# Patient Record
Sex: Female | Born: 1980
Health system: Southern US, Community
[De-identification: ages and names within clinical notes are randomized; demographics above are authoritative.]

## PROBLEM LIST (undated history)

## (undated) DIAGNOSIS — D649 Anemia, unspecified: Secondary | ICD-10-CM

## (undated) DIAGNOSIS — O039 Complete or unspecified spontaneous abortion without complication: Secondary | ICD-10-CM

## (undated) DIAGNOSIS — B009 Herpesviral infection, unspecified: Secondary | ICD-10-CM

## (undated) HISTORY — PX: WISDOM TOOTH EXTRACTION: SHX21

## (undated) HISTORY — PX: ABDOMINAL HYSTERECTOMY: SHX81

## (undated) HISTORY — DX: Anemia, unspecified: D64.9

---

## 2008-01-19 DIAGNOSIS — O039 Complete or unspecified spontaneous abortion without complication: Secondary | ICD-10-CM

## 2008-01-19 HISTORY — DX: Complete or unspecified spontaneous abortion without complication: O03.9

## 2012-02-19 LAB — HM PAP SMEAR: HM Pap smear: NORMAL

## 2012-06-08 ENCOUNTER — Ambulatory Visit: Payer: Self-pay | Admitting: Family Medicine

## 2012-06-15 ENCOUNTER — Encounter: Payer: Self-pay | Admitting: Family Medicine

## 2012-06-15 ENCOUNTER — Telehealth: Payer: Self-pay

## 2012-06-15 ENCOUNTER — Ambulatory Visit (INDEPENDENT_AMBULATORY_CARE_PROVIDER_SITE_OTHER): Payer: PRIVATE HEALTH INSURANCE | Admitting: Family Medicine

## 2012-06-15 VITALS — BP 120/74 | Temp 98.2°F | Ht 68.0 in | Wt 167.0 lb

## 2012-06-15 DIAGNOSIS — Z309 Encounter for contraceptive management, unspecified: Secondary | ICD-10-CM

## 2012-06-15 DIAGNOSIS — IMO0001 Reserved for inherently not codable concepts without codable children: Secondary | ICD-10-CM

## 2012-06-15 DIAGNOSIS — Z7189 Other specified counseling: Secondary | ICD-10-CM

## 2012-06-15 DIAGNOSIS — Z7689 Persons encountering health services in other specified circumstances: Secondary | ICD-10-CM

## 2012-06-15 LAB — LIPID PANEL
Cholesterol: 121 mg/dL (ref 0–200)
Triglycerides: 59 mg/dL (ref 0.0–149.0)

## 2012-06-15 MED ORDER — NORGESTIM-ETH ESTRAD TRIPHASIC 0.18/0.215/0.25 MG-35 MCG PO TABS: 1.0000 | ORAL_TABLET | Freq: Every day | ORAL | Status: DC

## 2012-06-15 NOTE — Progress Notes (Signed)
Chief Complaint  Patient presents with  . Establish Care  . birth control concern    HPI:  Sarah Sherman is here to establish care. Recently moved to [place]. Recently had baby in December. Last PCP and physical: during recent pregnancy, Feb 2014 pap normal  Has the following chronic problems and concerns today:  Contraception: -progestin only birth control as was breast feeding, now has stopped breast feeding and would like to restart ortho tricyclen that she was on in the past -denies: hx migraine with aura, blood clots, HTN -just restarted periods the beginning of this month and has been irregular  There are no active problems to display for this patient.  Health Maintenance: -UTD  ROS: See pertinent positives and negatives per HPI.  History reviewed. No pertinent past medical history.  Family History  Problem Relation Age of Onset  . Diabetes Mother   . Multiple sclerosis Mother   . Diabetes Maternal Grandmother   . Hypertension Maternal Grandmother   . Cancer Maternal Aunt 44    History   Social History  . Marital Status: Married    Spouse Name: N/A    Number of Children: N/A  . Years of Education: N/A   Social History Main Topics  . Smoking status: Never Smoker   . Smokeless tobacco: None  . Alcohol Use: No  . Drug Use: No  . Sexually Active: Yes    Birth Control/ Protection: Pill   Other Topics Concern  . 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  GENERAL: vitals reviewed and listed above, alert, oriented, appears well hydrated and in no acute distress  HEENT: atraumatic, conjunttiva clear, no obvious abnormalities on inspection of external nose and ears  NECK: no obvious masses on inspection  LUNGS: clear to auscultation bilaterally, no wheezes, rales or rhonchi, good air movement  CV: HRRR, no peripheral edema  MS: moves all extremities without noticeable abnormality  PSYCH: pleasant and cooperative, no obvious depression or anxiety  ASSESSMENT AND PLAN:  Discussed the following assessment and plan:  Contraception - Plan: POCT urine pregnancy, Norgestimate-Ethinyl Estradiol Triphasic (ORTHO TRI-CYCLEN, 28,) 0.18/0.215/0.25 MG-35 MCG tablet -upreg neg -stop current birth control and start ortho tri cyclen - proper use and risks discussed  Encounter to establish care - Plan: Lipid Panel -NON-FASTING lipids -reports all other labs done during pregnancy including hgba1c neg  -We reviewed the PMH, PSH, FH, SH, Meds and Allergies. -We provided refills for any medications we will prescribe as needed. -We addressed current concerns per orders and patient instructions. -We have asked for records for pertinent exams, studies, vaccines and notes from previous providers. -We have advised patient to follow up per instructions below.   -Patient advised to return or notify a doctor immediately if symptoms worsen or persist or new concerns arise.  Patient Instructions  -  We have ordered labs or studies at this visit. It can take up to 1-2 weeks for results and processing. We will contact you with instructions IF your results are abnormal. Normal results will be released to your [facility]. If you have not heard from [place] or can not find your results in [facility] in 2 weeks please contact our office.  -PLEASE SIGN UP FOR MYCHART TODAY   We recommend the following healthy  lifestyle measures: - eat a healthy diet consisting of lots of vegetables, fruits, beans, nuts, seeds, healthy meats such as white chicken and fish and whole grains.  - avoid fried foods, fast food, processed foods, sodas, red meet and other fattening foods.  - get a least 150 minutes of aerobic exercise per week.   Follow up in: 1 year or sooner if needed      Sarah Sherman, Dahlia Client R.

## 2012-06-15 NOTE — Telephone Encounter (Signed)
Received a vm from pt stating that [facility] will not take a hand written prescription for birth control. Pt left a number to call [phone] for mail order.  Called and left a message for rx to be filled.  Also called and spoke with pt and she is aware.

## 2012-06-15 NOTE — Patient Instructions (Signed)
-  We have ordered labs or studies at this visit. It can take up to 1-2 weeks for results and processing. We will contact you with instructions IF your results are abnormal. Normal results will be released to your MYCHART. If you have not heard from us or can not find your results in MYCHART in 2 weeks please contact our office.  -PLEASE SIGN UP FOR MYCHART TODAY   We recommend the following healthy lifestyle measures: - eat a healthy diet consisting of lots of vegetables, fruits, beans, nuts, seeds, healthy meats such as white chicken and fish and whole grains.  - avoid fried foods, fast food, processed foods, sodas, red meet and other fattening foods.  - get a least 150 minutes of aerobic exercise per week.   Follow up in: 1 year or sooner if needed  

## 2012-06-16 NOTE — Progress Notes (Signed)
Quick Note:  Called and spoke with pt and pt is aware. ______ 

## 2012-06-20 ENCOUNTER — Encounter: Payer: Self-pay | Admitting: Family Medicine

## 2012-06-20 NOTE — Progress Notes (Signed)
Received recent results from [facility]. Normal pap 02/22/12. HgbA1c normal 12/20/11. Scanned in.

## 2012-07-11 ENCOUNTER — Ambulatory Visit: Payer: PRIVATE HEALTH INSURANCE

## 2012-10-03 ENCOUNTER — Telehealth: Payer: Self-pay | Admitting: Family Medicine

## 2012-10-03 NOTE — Telephone Encounter (Signed)
pls advise

## 2012-10-03 NOTE — Telephone Encounter (Signed)
Pt calling to inquire about a tb test, done as a lab, and not a skin test. She states that it's easier, because she wouldn't have to come back to have it read, and that its supposed to be more accurate. Please advise.

## 2012-10-03 NOTE — Telephone Encounter (Signed)
Can do quantiferon gold test. But need to know why she is getting tested - any symptoms (fevers, cough, etc, foreign travel to high risk areas, positive skin tes tin past, etc).

## 2012-10-04 NOTE — Telephone Encounter (Signed)
Called and spoke with pt and pt states she just needs this done for work and with her work schedule she cannot come back and forth to get tb skin test reading done.  Pt denies symptoms of fever, cough, etc, foreign travel to high risk area, positive skin test in past etc.). Pls advise.

## 2012-10-10 ENCOUNTER — Ambulatory Visit: Payer: PRIVATE HEALTH INSURANCE

## 2013-01-15 ENCOUNTER — Emergency Department (HOSPITAL_COMMUNITY)
Admission: EM | Admit: 2013-01-15 | Discharge: 2013-01-15 | Disposition: A | Discharge status: Home / Self Care | Payer: 59 | Source: Home / Self Care

## 2013-01-17 ENCOUNTER — Encounter: Payer: PRIVATE HEALTH INSURANCE | Admitting: Family Medicine

## 2013-01-17 NOTE — Progress Notes (Signed)
Error   This encounter was created in error - please disregard. 

## 2013-02-14 ENCOUNTER — Encounter: Payer: Self-pay | Admitting: Family Medicine

## 2013-02-14 ENCOUNTER — Ambulatory Visit (INDEPENDENT_AMBULATORY_CARE_PROVIDER_SITE_OTHER): Payer: 59 | Admitting: Family Medicine

## 2013-02-14 VITALS — BP 120/80 | Temp 98.2°F | Ht 67.5 in | Wt 166.0 lb

## 2013-02-14 DIAGNOSIS — IMO0001 Reserved for inherently not codable concepts without codable children: Secondary | ICD-10-CM

## 2013-02-14 DIAGNOSIS — Z0000 Encounter for general adult medical examination without abnormal findings: Secondary | ICD-10-CM

## 2013-02-14 DIAGNOSIS — Z309 Encounter for contraceptive management, unspecified: Secondary | ICD-10-CM

## 2013-02-14 MED ORDER — NORGESTIM-ETH ESTRAD TRIPHASIC 0.18/0.215/0.25 MG-35 MCG PO TABS: 1.0000 | ORAL_TABLET | Freq: Every day | ORAL | Status: DC

## 2013-02-14 NOTE — Patient Instructions (Signed)
Take folic acid daily  Vit D 3 1000 IU daily  Calcium total of 1200mg  daily (half of this likely comes from your diet)  Self breast exams  We recommend the following healthy lifestyle measures: - eat a healthy diet consisting of lots of vegetables, fruits, beans, nuts, seeds, healthy meats such as white chicken and fish and whole grains.  - avoid fried foods, fast food, processed foods, sodas, red meet and other fattening foods.  - get a least 150 minutes of aerobic exercise per week.

## 2013-02-14 NOTE — Progress Notes (Signed)
Chief Complaint  Patient presents with  . Annual Exam    [employer] - [employer]    HPI:  Here for CPE:  -Concerns today: none  -Diet: ok  -Taking folic acid, Vit D or Calcium:   -Exercise: no regular exercise  -Diabetes and Dyslipidemia Screening: done 12/2011 and 05/2012 and normal  -Hx of HTN: no  -Vaccines: UTD  -pap history: feb 14 normal per her report  -FDLMP: about 1 month ago  -sexual activity: no new partners  -wants STI testing: no  -FH breast, colon or ovarian ca: see FH  -Alcohol, Tobacco, drug use: see social history  Review of Systems - Review of Systems  Constitutional: Negative for fever and malaise/fatigue.  HENT: Negative for hearing loss.   Eyes: Negative for blurred vision.  Respiratory: Negative for shortness of breath.   Cardiovascular: Negative for chest pain and palpitations.  Gastrointestinal: Negative for blood in stool and melena.  Genitourinary: Negative for dysuria, urgency and hematuria.  Musculoskeletal: Negative for falls.  Skin: Negative for rash.  Neurological: Negative for dizziness and seizures.  Endo/Heme/Allergies: Does not bruise/bleed easily.  Psychiatric/Behavioral: Negative for depression.     No past medical history on file.  Past Surgical History  Procedure Laterality Date  . Cesarean section  2011 and 2013    Family History  Problem Relation Age of Onset  . Diabetes Mother   . Multiple sclerosis Mother   . Diabetes Maternal Grandmother   . Hypertension Maternal Grandmother   . Cancer Maternal Aunt 54    History   Social History  . Marital Status: Married    Spouse Name: N/A    Number of Children: N/A  . Years of Education: N/A   Social History Main Topics  . Smoking status: Never Smoker   . Smokeless tobacco: None  . Alcohol Use: No  . Drug Use: No  . Sexual Activity: Yes    Birth Control/ Protection: Pill   Other Topics Concern  . 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      Home Situation: lives with husband and kids      Spiritual Beliefs: christian      Lifestyle: going to [place] when child is 85 months old, diet is healthy             Current outpatient prescriptions:Norgestimate-Ethinyl Estradiol Triphasic (ORTHO TRI-CYCLEN, 28,) 0.18/0.215/0.25 MG-35 MCG tablet, Take 1 tablet by mouth daily., Disp: 1 Package, Rfl: 11  EXAM:  Filed Vitals:   02/14/13 0940  BP: 120/80  Temp: 98.2 F (36.8 C)    GENERAL: vitals reviewed and listed below, alert, oriented, appears well hydrated and in no acute distress  HEENT: head atraumatic, PERRLA, normal appearance of eyes, ears, nose and mouth. moist mucus membranes.  NECK: supple, no masses or lymphadenopathy  LUNGS: clear to auscultation bilaterally, no rales, rhonchi or wheeze  CV: HRRR, no peripheral edema or cyanosis, normal pedal pulses  BREAST: deferred  ABDOMEN: bowel sounds normal, soft, non tender to palpation, no masses, no rebound or guarding  GU: deferred  RECTAL: refused  SKIN: no rash or abnormal lesions  MS: normal gait, moves all extremities normally  NEURO: CN II-XII grossly intact, normal muscle strength and sensation to light touch on extremities  PSYCH: normal affect, pleasant and cooperative  ASSESSMENT AND PLAN:  Discussed the following assessment and plan:  Visit  for preventive health examination  Contraception - Plan: Norgestimate-Ethinyl Estradiol Triphasic (ORTHO TRI-CYCLEN, 28,) 0.18/0.215/0.25 MG-35 MCG tablet   -Discussed and advised all [place] preventive services health task force level A and B recommendations for age, sex and risks.  -Advised at least 150 minutes of exercise per week and a healthy diet low in saturated fats and sweets and consisting of fresh fruits and vegetables, lean meats such as fish and white chicken and whole grains.  -labs, studies and vaccines per  orders this encounter  -refilled birth control  No orders of the defined types were placed in this encounter.    Patient Instructions  Take folic acid daily  Vit D 3 1000 IU daily  Calcium total of 1200mg  daily (half of this likely comes from your diet)  Self breast exams  We recommend the following healthy lifestyle measures: - eat a healthy diet consisting of lots of vegetables, fruits, beans, nuts, seeds, healthy meats such as white chicken and fish and whole grains.  - avoid fried foods, fast food, processed foods, sodas, red meet and other fattening foods.  - get a least 150 minutes of aerobic exercise per week.      Patient advised to return to clinic immediately if symptoms worsen or persist or new concerns.  @LIFEPLAN @  Return in about 1 year (around 02/14/2014) for CPE.  Sarah Sherman.

## 2013-02-14 NOTE — Progress Notes (Signed)
Pre visit review using our clinic review tool, if applicable. No additional management support is needed unless otherwise documented below in the visit note. 

## 2013-02-26 ENCOUNTER — Ambulatory Visit (INDEPENDENT_AMBULATORY_CARE_PROVIDER_SITE_OTHER): Payer: 59 | Admitting: Family Medicine

## 2013-02-26 ENCOUNTER — Encounter: Payer: Self-pay | Admitting: Family Medicine

## 2013-02-26 VITALS — BP 120/80 | Temp 98.4°F | Wt 166.0 lb

## 2013-02-26 DIAGNOSIS — N912 Amenorrhea, unspecified: Secondary | ICD-10-CM

## 2013-02-26 LAB — POCT URINE PREGNANCY: PREG TEST UR: POSITIVE

## 2013-02-26 NOTE — Progress Notes (Signed)
Pre visit review using our clinic review tool, if applicable. No additional management support is needed unless otherwise documented below in the visit note. 

## 2013-02-26 NOTE — Progress Notes (Signed)
Chief Complaint  Patient presents with  . Possible Pregnancy    HPI:  Acute visit for ? Pregnancy: -reports missed period this month -thinks last period was January - but unsure of last period -mild breast tenderness -no discharge, bleeding, vomiting  ROS: See pertinent positives and negatives per HPI.  No past medical history on file.  Past Surgical History  Procedure Laterality Date  . Cesarean section  2011 and 2013    Family History  Problem Relation Age of Onset  . Diabetes Mother   . Multiple sclerosis Mother   . Diabetes Maternal Grandmother   . Hypertension Maternal Grandmother   . Cancer Maternal Aunt 79    History   Social History  . Marital Status: Married    Spouse Name: N/A    Number of Children: N/A  . Years of Education: N/A   Social History Main Topics  . Smoking status: Never Smoker   . Smokeless tobacco: None  . Alcohol Use: No  . Drug Use: No  . Sexual Activity: Yes    Birth Control/ Protection: Pill   Other Topics Concern  . 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  GENERAL: vitals reviewed and listed above, alert, oriented, appears well hydrated and in no acute distress  HEENT: atraumatic, conjunttiva clear, no obvious abnormalities on inspection of external nose and ears  NECK: no obvious masses on inspection  LUNGS: clear to auscultation  bilaterally, no wheezes, rales or rhonchi, good air movement  CV: HRRR, no peripheral edema  MS: moves all extremities without noticeable abnormality  PSYCH: pleasant and cooperative, no obvious depression or anxiety  ASSESSMENT AND PLAN:  Discussed the following assessment and plan:  Amenorrhea  -urine preg positive -has follow up with ob in next few weeks for first visit -Patient advised to return or notify a doctor immediately if symptoms worsen or persist or new concerns arise.  Patient Instructions  Pregnancy If you are planning on getting pregnant, it is a good idea to make a preconception appointment with your caregiver to discuss having a healthy lifestyle before getting pregnant. This includes diet, weight, exercise, taking prenatal vitamins (especially folic acid, which helps prevent brain and spinal cord defects), avoiding alcohol, smoking and illegal drugs, medical problems (diabetes, convulsions), family history of genetic problems, working conditions, and immunizations. It is better to have knowledge of these things and do something about them before getting pregnant. During your pregnancy, it is important to follow certain guidelines in order to have a healthy baby. It is very important to get good prenatal care and follow your caregiver's instructions. Prenatal care includes all the medical care you receive before your baby's birth. This helps to prevent problems during the pregnancy and childbirth. HOME CARE INSTRUCTIONS   Start  your prenatal visits by the 12th week of pregnancy or earlier, if possible. At first, appointments are usually scheduled monthly. They become more frequent in the last 2 months before delivery. It is important that you keep your caregiver's appointments and follow your caregiver's instructions regarding medication use, exercise, and diet.  During pregnancy, you are providing food for you and your baby. Eat a regular, well-balanced diet. Choose foods  such as meat, fish, milk and other dairy products, vegetables, fruits, whole-grain breads and cereals. Your caregiver will inform you of the ideal weight gain depending on your current height and weight. Drink lots of liquids. Try to drink 8 glasses of water a day.  Alcohol is associated with a number of birth defects including fetal alcohol syndrome. It is best to avoid alcohol completely. Smoking will cause low birth rate and prematurity. Use of alcohol and nicotine during your pregnancy also increases the chances that your child will be chemically dependent later in their life and may contribute to SIDS (Sudden Infant Death Syndrome).  Do not use illegal drugs.  Only take prescription or over-the-counter medications that are recommended by your caregiver. Other medications can cause genetic and physical problems in the baby.  Morning sickness can often be helped by keeping soda crackers at the bedside. Eat a few before getting up in the morning.  A sexual relationship may be continued until near the end of pregnancy if there are no other problems such as early (premature) leaking of amniotic fluid from the membranes, vaginal bleeding, painful intercourse or belly (abdominal) pain.  Exercise regularly. Check with your caregiver if you are unsure of the safety of some of your exercises.  Do not use hot tubs, steam rooms or saunas. These increase the risk of fainting and hurting yourself and the baby. Swimming is OK for exercise. Get plenty of rest, including afternoon naps when possible, especially in the third trimester.  Avoid toxic odors and chemicals.  Do not wear high heels. They may cause you to lose your balance and fall.  Do not lift over 5 pounds. If you do lift anything, lift with your legs and thighs, not your back.  Avoid long trips, especially in the third trimester.  If you have to travel out of the city or state, take a copy of your medical records with you. SEEK IMMEDIATE  MEDICAL CARE IF:   You develop an unexplained oral temperature above 102 F (38.9 C), or as your caregiver suggests.  You have leaking of fluid from the vagina. If leaking membranes are suspected, take your temperature and inform your caregiver of this when you call.  There is vaginal spotting or bleeding. Notify your caregiver of the amount and how many pads are used.  You continue to feel sick to your stomach (nauseous) and have no relief from remedies suggested, or you throw up (vomit) blood or coffee ground like materials.  You develop upper abdominal pain.  You have round ligament discomfort in the lower abdominal area. This still must be evaluated by your caregiver.  You feel contractions of the uterus.  You do not feel the baby move, or there is less movement than before.  You have painful urination.  You have abnormal vaginal discharge.  You have persistent diarrhea.  You get a severe headache.  You have problems with your vision.  You develop muscle weakness.  You feel dizzy and faint.  You develop shortness of breath.  You develop chest pain.  You have back  pain that travels down to your leg and feet.  You feel irregular or a very fast heartbeat.  You develop excessive weight gain in a short period of time (5 pounds in 3 to 5 days).  You are involved in a domestic violence situation. Document Released: 01/04/2005 Document Revised: 07/06/2011 Document Reviewed: 06/28/2008 [facility] Patient Information 2014 Sarah Sherman, Sarah Sherman [place], [place]

## 2013-02-26 NOTE — Patient Instructions (Signed)

## 2013-02-26 NOTE — Addendum Note (Signed)
Addended by: Colleen Can on: 02/26/2013 01:36 PM   Modules accepted: Orders

## 2013-03-22 LAB — OB RESULTS CONSOLE GC/CHLAMYDIA
Chlamydia: NEGATIVE
Gonorrhea: NEGATIVE

## 2013-03-22 LAB — OB RESULTS CONSOLE HIV ANTIBODY (ROUTINE TESTING): HIV: NONREACTIVE

## 2013-03-22 LAB — OB RESULTS CONSOLE ANTIBODY SCREEN: ANTIBODY SCREEN: NEGATIVE

## 2013-03-22 LAB — OB RESULTS CONSOLE ABO/RH: RH Type: NEGATIVE

## 2013-03-22 LAB — OB RESULTS CONSOLE HEPATITIS B SURFACE ANTIGEN: HEP B S AG: NEGATIVE

## 2013-03-22 LAB — OB RESULTS CONSOLE RPR: RPR: NONREACTIVE

## 2013-05-17 ENCOUNTER — Telehealth: Payer: Self-pay | Admitting: Family Medicine

## 2013-05-17 DIAGNOSIS — Z299 Encounter for prophylactic measures, unspecified: Secondary | ICD-10-CM

## 2013-05-17 NOTE — Telephone Encounter (Signed)
Pt has cpe in Jan and did not have labs. Pt is starting Nurse Prac program and needs paper work for cpe filled out w/ lab work done/  varicella and hep b titer/quant gold for tb. Is it ok to sch these labs?Marland Kitchen

## 2013-05-18 NOTE — Telephone Encounter (Signed)
Pt needs Varicella titer, mmr titer and hep b titer/quant gold for tb. All CPE labs including UA Pt has scheduled her lab appt 5/6. Pt works the night shift at [employer], so will come in fasting Wed.  5/6 afternoon

## 2013-05-18 NOTE — Telephone Encounter (Signed)
Yep. Thanks. Double check to confirm which labs she needs then can do orders and schedule lab appt.

## 2013-05-23 ENCOUNTER — Other Ambulatory Visit (INDEPENDENT_AMBULATORY_CARE_PROVIDER_SITE_OTHER): Payer: 59

## 2013-05-23 DIAGNOSIS — Z299 Encounter for prophylactic measures, unspecified: Secondary | ICD-10-CM

## 2013-05-23 LAB — LIPID PANEL
CHOL/HDL RATIO: 2
Cholesterol: 148 mg/dL (ref 0–200)
HDL: 64.4 mg/dL (ref >39.00)
LDL Cholesterol: 69 mg/dL (ref 0–99)
Triglycerides: 72 mg/dL (ref 0.0–149.0)
VLDL: 14.4 mg/dL (ref 0.0–40.0)

## 2013-05-23 LAB — URINALYSIS, ROUTINE W REFLEX MICROSCOPIC
Bilirubin Urine: NEGATIVE
Hgb urine dipstick: NEGATIVE
KETONES UR: NEGATIVE
Leukocytes, UA: NEGATIVE
Nitrite: NEGATIVE
PH: 7 (ref 5.0–8.0)
RBC / HPF: NONE SEEN (ref 0–?)
Specific Gravity, Urine: 1.015 (ref 1.000–1.030)
Total Protein, Urine: NEGATIVE
Urine Glucose: NEGATIVE
Urobilinogen, UA: 0.2 (ref 0.0–1.0)

## 2013-05-23 LAB — CBC WITH DIFFERENTIAL/PLATELET
BASOS ABS: 0 10*3/uL (ref 0.0–0.1)
BASOS PCT: 0.3 % (ref 0.0–3.0)
EOS ABS: 0.1 10*3/uL (ref 0.0–0.7)
Eosinophils Relative: 1.2 % (ref 0.0–5.0)
HCT: 34.8 % — ABNORMAL LOW (ref 36.0–46.0)
Hemoglobin: 11.2 g/dL — ABNORMAL LOW (ref 12.0–15.0)
Lymphocytes Relative: 21.8 % (ref 12.0–46.0)
Lymphs Abs: 2.5 10*3/uL (ref 0.7–4.0)
MCHC: 32.3 g/dL (ref 30.0–36.0)
MCV: 95 fl (ref 78.0–100.0)
Monocytes Absolute: 0.8 10*3/uL (ref 0.1–1.0)
Monocytes Relative: 6.7 % (ref 3.0–12.0)
NEUTROS PCT: 70 % (ref 43.0–77.0)
Neutro Abs: 7.9 10*3/uL — ABNORMAL HIGH (ref 1.4–7.7)
Platelets: 191 10*3/uL (ref 150.0–400.0)
RBC: 3.67 Mil/uL — AB (ref 3.87–5.11)
RDW: 13.4 % (ref 11.5–15.5)
WBC: 11.3 10*3/uL — ABNORMAL HIGH (ref 4.0–10.5)

## 2013-05-23 LAB — TSH: TSH: 1.16 u[IU]/mL (ref 0.35–4.50)

## 2013-05-23 LAB — HEMOGLOBIN A1C: Hgb A1c MFr Bld: 3.9 % — ABNORMAL LOW (ref 4.6–6.5)

## 2013-05-24 DIAGNOSIS — Z0279 Encounter for issue of other medical certificate: Secondary | ICD-10-CM

## 2013-05-24 LAB — VARICELLA ZOSTER ANTIBODY, IGG: Varicella IgG: 3151 Index — ABNORMAL HIGH (ref <135.00)

## 2013-05-24 LAB — MEASLES/MUMPS/RUBELLA IMMUNITY
Mumps IgG: 70.3 AU/mL — ABNORMAL HIGH (ref <9.00)
Rubella: 7.68 Index — ABNORMAL HIGH (ref <0.90)
Rubeola IgG: 72 AU/mL — ABNORMAL HIGH (ref <25.00)

## 2013-05-24 LAB — HEPATITIS B SURFACE ANTIBODY,QUALITATIVE: Hep B S Ab: POSITIVE — AB

## 2013-05-25 LAB — QUANTIFERON TB GOLD ASSAY (BLOOD)
Interferon Gamma Release Assay: NEGATIVE
Mitogen value: 4.01 IU/mL
QUANTIFERON NIL VALUE: 0.02 [IU]/mL
QUANTIFERON TB AG MINUS NIL: 0.01 [IU]/mL
TB AG VALUE: 0.03 [IU]/mL

## 2013-05-29 ENCOUNTER — Encounter: Payer: 59 | Admitting: Family Medicine

## 2013-05-29 NOTE — Progress Notes (Signed)
This encounter was created in error - please disregard.

## 2013-10-09 ENCOUNTER — Encounter (HOSPITAL_COMMUNITY): Payer: Self-pay

## 2013-10-16 NOTE — H&P (Signed)
Sarah Sherman is a 33 y.o. female presenting for repeat C/S; 2 previous C/S.  Patient's antepartum course complicated by Rh negative, s/p rhogam.  The patient has HSV and declined suppression secondary to rare outbreaks; none this pregnancy.  Early pregnancy was complicated by large Sarah Sherman but this resolved.   Maternal Medical History:  Fetal activity: Perceived fetal activity is normal.   Last perceived fetal movement was within the past hour.    Prenatal complications: no prenatal complications Prenatal Complications - Diabetes: none.    OB History   Grav Para Term Preterm Abortions TAB SAB Ect Mult Living   2 2             No past medical history on file. Past Surgical History  Procedure Laterality Date  . Cesarean section  2011 and 2013   Family History: family history includes Cancer (age of onset: 52) in her maternal aunt; Diabetes in her maternal grandmother and mother; Hypertension in her maternal grandmother; Multiple sclerosis in her mother. Social History:  reports that she has never smoked. She does not have any smokeless tobacco history on file. She reports that she does not drink alcohol or use illicit drugs.   Prenatal Transfer Tool  Maternal Diabetes: No Genetic Screening: Normal Maternal Ultrasounds/Referrals: Normal Fetal Ultrasounds or other Referrals:  None Maternal Substance Abuse:  No Significant Maternal Medications:  None Significant Maternal Lab Results:  None Other Comments:  None  ROS    There were no vitals taken for this visit. Maternal Exam:  Abdomen: Patient reports no abdominal tenderness. Surgical scars: low transverse.     Physical Exam  Constitutional: She is oriented to person, place, and time. She appears well-developed and well-nourished.  GI: Soft. There is no rebound and no guarding.  Neurological: She is alert and oriented to person, place, and time.  Skin: Skin is warm and dry.  Psychiatric: She has a normal mood and affect.  Her behavior is normal.    Prenatal labs: ABO, Rh:   Antibody:   Rubella: 7.68 (05/06 1517) RPR:    HBsAg:    HIV:    GBS:     Assessment/Plan: 33yo Sarah Sherman at 39 weeks with 2 previous C/S -Repeat C/S  Sarah Sherman 10/16/2013, 8:32 PM

## 2013-10-17 ENCOUNTER — Encounter (HOSPITAL_COMMUNITY): Payer: Self-pay

## 2013-10-19 ENCOUNTER — Encounter (HOSPITAL_COMMUNITY): Payer: Self-pay

## 2013-10-19 ENCOUNTER — Encounter (HOSPITAL_COMMUNITY)
Admission: RE | Admit: 2013-10-19 | Discharge: 2013-10-19 | Disposition: A | Discharge status: Home / Self Care | Payer: 59 | Source: Ambulatory Visit | Attending: Obstetrics & Gynecology | Admitting: Obstetrics & Gynecology

## 2013-10-19 HISTORY — DX: Herpesviral infection, unspecified: B00.9

## 2013-10-19 HISTORY — DX: Complete or unspecified spontaneous abortion without complication: O03.9

## 2013-10-19 LAB — CBC
HCT: 35.3 % — ABNORMAL LOW (ref 36.0–46.0)
Hemoglobin: 11.4 g/dL — ABNORMAL LOW (ref 12.0–15.0)
MCH: 30.4 pg (ref 26.0–34.0)
MCHC: 32.3 g/dL (ref 30.0–36.0)
MCV: 94.1 fL (ref 78.0–100.0)
PLATELETS: 173 10*3/uL (ref 150–400)
RBC: 3.75 MIL/uL — ABNORMAL LOW (ref 3.87–5.11)
RDW: 13.3 % (ref 11.5–15.5)
WBC: 12.4 10*3/uL — ABNORMAL HIGH (ref 4.0–10.5)

## 2013-10-19 NOTE — Patient Instructions (Addendum)
   Your procedure is scheduled on:  Monday, Oct 5  Enter through the Main Entrance of [facility] at: 6 AM Pick up the phone at the desk and dial [phone] and inform us of your arrival.  Please call this number if you have any problems the morning of surgery: [phone]  Remember: Do not eat or drink after midnight: Sunday: Take these medicines the morning of surgery with a SIP OF WATER: None  Do not wear jewelry, make-up, or FINGER nail polish No metal in your hair or on your body. Do not wear lotions, powders, perfumes.  You may wear deodorant.  Do not bring valuables to the hospital. Contacts, dentures or bridgework may not be worn into surgery.  Leave suitcase in the car. After Surgery it may be brought to your room. For patients being admitted to the hospital, checkout time is 11:00am the day of discharge.  Home with husband Randall Hiss cell [phone]

## 2013-10-20 LAB — RPR

## 2013-10-21 ENCOUNTER — Inpatient Hospital Stay (HOSPITAL_COMMUNITY)
Admission: AD | Admit: 2013-10-21 | Discharge: 2013-10-23 | DRG: 766 | Disposition: A | Discharge status: Home / Self Care | Payer: 59 | Source: Ambulatory Visit | Attending: Obstetrics and Gynecology | Admitting: Obstetrics and Gynecology

## 2013-10-21 ENCOUNTER — Encounter (HOSPITAL_COMMUNITY)
Admission: AD | Disposition: A | Discharge status: Home / Self Care | Payer: Self-pay | Source: Ambulatory Visit | Attending: Obstetrics and Gynecology

## 2013-10-21 ENCOUNTER — Inpatient Hospital Stay (HOSPITAL_COMMUNITY): Payer: 59 | Admitting: Anesthesiology

## 2013-10-21 ENCOUNTER — Encounter (HOSPITAL_COMMUNITY): Payer: Self-pay | Admitting: *Deleted

## 2013-10-21 ENCOUNTER — Encounter (HOSPITAL_COMMUNITY): Payer: 59 | Admitting: Anesthesiology

## 2013-10-21 DIAGNOSIS — Z833 Family history of diabetes mellitus: Secondary | ICD-10-CM | POA: Diagnosis not present

## 2013-10-21 DIAGNOSIS — D649 Anemia, unspecified: Secondary | ICD-10-CM | POA: Diagnosis present

## 2013-10-21 DIAGNOSIS — O9902 Anemia complicating childbirth: Secondary | ICD-10-CM | POA: Diagnosis present

## 2013-10-21 DIAGNOSIS — K219 Gastro-esophageal reflux disease without esophagitis: Secondary | ICD-10-CM | POA: Diagnosis present

## 2013-10-21 DIAGNOSIS — O99214 Obesity complicating childbirth: Secondary | ICD-10-CM | POA: Diagnosis present

## 2013-10-21 DIAGNOSIS — Z8249 Family history of ischemic heart disease and other diseases of the circulatory system: Secondary | ICD-10-CM | POA: Diagnosis not present

## 2013-10-21 DIAGNOSIS — Z3A39 39 weeks gestation of pregnancy: Secondary | ICD-10-CM | POA: Diagnosis present

## 2013-10-21 DIAGNOSIS — O3421 Maternal care for scar from previous cesarean delivery: Secondary | ICD-10-CM | POA: Diagnosis present

## 2013-10-21 DIAGNOSIS — Z98891 History of uterine scar from previous surgery: Secondary | ICD-10-CM

## 2013-10-21 DIAGNOSIS — Z3483 Encounter for supervision of other normal pregnancy, third trimester: Secondary | ICD-10-CM | POA: Diagnosis present

## 2013-10-21 DIAGNOSIS — Z820 Family history of epilepsy and other diseases of the nervous system: Secondary | ICD-10-CM

## 2013-10-21 LAB — CBC
HCT: 34.8 % — ABNORMAL LOW (ref 36.0–46.0)
HEMOGLOBIN: 11.3 g/dL — AB (ref 12.0–15.0)
MCH: 30.2 pg (ref 26.0–34.0)
MCHC: 32.5 g/dL (ref 30.0–36.0)
MCV: 93 fL (ref 78.0–100.0)
PLATELETS: 184 10*3/uL (ref 150–400)
RBC: 3.74 MIL/uL — ABNORMAL LOW (ref 3.87–5.11)
RDW: 13.6 % (ref 11.5–15.5)
WBC: 14 10*3/uL — ABNORMAL HIGH (ref 4.0–10.5)

## 2013-10-21 SURGERY — Surgical Case
Anesthesia: Spinal

## 2013-10-21 MED ORDER — LACTATED RINGERS IV SOLN
INTRAVENOUS | Status: DC | PRN
  Administered 2013-10-21 (×2): via INTRAVENOUS

## 2013-10-21 MED ORDER — PHENYLEPHRINE 8 MG IN D5W 100 ML (0.08MG/ML) PREMIX OPTIME
INJECTION | INTRAVENOUS | Status: DC | PRN
  Administered 2013-10-21: 80 ug/min via INTRAVENOUS

## 2013-10-21 MED ORDER — FENTANYL CITRATE 0.05 MG/ML IJ SOLN
INTRAMUSCULAR | Status: DC | PRN
  Administered 2013-10-21: 25 ug via INTRATHECAL

## 2013-10-21 MED ORDER — MEPERIDINE HCL 25 MG/ML IJ SOLN
INTRAMUSCULAR | Status: DC | PRN
  Administered 2013-10-21 (×2): 12.5 mg via INTRAVENOUS

## 2013-10-21 MED ORDER — PHENYLEPHRINE 8 MG IN D5W 100 ML (0.08MG/ML) PREMIX OPTIME
INJECTION | INTRAVENOUS | Status: AC
  Filled 2013-10-21: qty 100

## 2013-10-21 MED ORDER — ONDANSETRON HCL 4 MG/2ML IJ SOLN
INTRAMUSCULAR | Status: DC | PRN
  Administered 2013-10-21: 4 mg via INTRAVENOUS

## 2013-10-21 MED ORDER — ONDANSETRON HCL 4 MG/2ML IJ SOLN
INTRAMUSCULAR | Status: AC
  Filled 2013-10-21: qty 2

## 2013-10-21 MED ORDER — CEFAZOLIN SODIUM-DEXTROSE 2-3 GM-% IV SOLR
2.0000 g | Freq: Once | INTRAVENOUS | Status: AC
  Administered 2013-10-21: 2 g via INTRAVENOUS
  Filled 2013-10-21: qty 50

## 2013-10-21 MED ORDER — FENTANYL CITRATE 0.05 MG/ML IJ SOLN
INTRAMUSCULAR | Status: AC
  Filled 2013-10-21: qty 2

## 2013-10-21 MED ORDER — OXYTOCIN 10 UNIT/ML IJ SOLN
INTRAMUSCULAR | Status: AC
  Filled 2013-10-21: qty 4

## 2013-10-21 MED ORDER — BUPIVACAINE IN DEXTROSE 0.75-8.25 % IT SOLN
INTRATHECAL | Status: DC | PRN
  Administered 2013-10-21: 13.5 mg via INTRATHECAL

## 2013-10-21 MED ORDER — CITRIC ACID-SODIUM CITRATE 334-500 MG/5ML PO SOLN
30.0000 mL | Freq: Once | ORAL | Status: AC
  Administered 2013-10-21: 30 mL via ORAL
  Filled 2013-10-21: qty 15

## 2013-10-21 MED ORDER — OXYTOCIN 10 UNIT/ML IJ SOLN
40.0000 [IU] | INTRAMUSCULAR | Status: DC | PRN
  Administered 2013-10-21: 40 [IU] via INTRAVENOUS

## 2013-10-21 MED ORDER — BUPIVACAINE LIPOSOME 1.3 % IJ SUSP
INTRAMUSCULAR | Status: DC | PRN
Start: 2013-10-21 — End: 2013-10-21
  Administered 2013-10-21: 30 mL

## 2013-10-21 MED ORDER — BUPIVACAINE LIPOSOME 1.3 % IJ SUSP
20.0000 mL | Freq: Once | INTRAMUSCULAR | Status: DC
  Filled 2013-10-21: qty 20

## 2013-10-21 MED ORDER — MORPHINE SULFATE 0.5 MG/ML IJ SOLN
INTRAMUSCULAR | Status: AC
  Filled 2013-10-21: qty 10

## 2013-10-21 MED ORDER — MEPERIDINE HCL 25 MG/ML IJ SOLN
INTRAMUSCULAR | Status: AC
  Filled 2013-10-21: qty 1

## 2013-10-21 MED ORDER — LACTATED RINGERS IV SOLN
INTRAVENOUS | Status: DC | PRN
  Administered 2013-10-21: 23:00:00 via INTRAVENOUS

## 2013-10-21 MED ORDER — MORPHINE SULFATE (PF) 0.5 MG/ML IJ SOLN
INTRAMUSCULAR | Status: DC | PRN
  Administered 2013-10-21: .15 mg via INTRATHECAL

## 2013-10-21 SURGICAL SUPPLY — 36 items
BENZOIN TINCTURE PRP APPL 2/3 (GAUZE/BANDAGES/DRESSINGS) ×3 IMPLANT
CLAMP CORD UMBIL (MISCELLANEOUS) IMPLANT
CLOSURE STERI STRIP 1/2 X4 (GAUZE/BANDAGES/DRESSINGS) ×3 IMPLANT
CLOSURE WOUND 1/2 X4 (GAUZE/BANDAGES/DRESSINGS)
CLOTH BEACON ORANGE TIMEOUT ST (SAFETY) ×3 IMPLANT
CONTAINER PREFILL 10% NBF 15ML (MISCELLANEOUS) IMPLANT
COVER LIGHT HANDLE  1/PK (MISCELLANEOUS) ×4
COVER LIGHT HANDLE 1/PK (MISCELLANEOUS) ×2 IMPLANT
DERMABOND ADVANCED (GAUZE/BANDAGES/DRESSINGS)
DERMABOND ADVANCED .7 DNX12 (GAUZE/BANDAGES/DRESSINGS) IMPLANT
DRAPE SHEET LG 3/4 BI-LAMINATE (DRAPES) IMPLANT
DRSG OPSITE POSTOP 4X10 (GAUZE/BANDAGES/DRESSINGS) ×3 IMPLANT
DURAPREP 26ML APPLICATOR (WOUND CARE) ×3 IMPLANT
ELECT REM PT RETURN 9FT ADLT (ELECTROSURGICAL) ×3
ELECTRODE REM PT RTRN 9FT ADLT (ELECTROSURGICAL) ×1 IMPLANT
EXTRACTOR VACUUM M CUP 4 TUBE (SUCTIONS) IMPLANT
EXTRACTOR VACUUM M CUP 4' TUBE (SUCTIONS)
GLOVE BIO SURGEON STRL SZ7.5 (GLOVE) ×3 IMPLANT
GOWN STRL REUS W/TWL LRG LVL3 (GOWN DISPOSABLE) ×6 IMPLANT
KIT ABG SYR 3ML LUER SLIP (SYRINGE) ×3 IMPLANT
NEEDLE HYPO 21X1.5 SAFETY (NEEDLE) ×3 IMPLANT
NEEDLE HYPO 25X5/8 SAFETYGLIDE (NEEDLE) ×3 IMPLANT
NS IRRIG 1000ML POUR BTL (IV SOLUTION) ×3 IMPLANT
PACK C SECTION WH (CUSTOM PROCEDURE TRAY) ×3 IMPLANT
PAD OB MATERNITY 4.3X12.25 (PERSONAL CARE ITEMS) ×3 IMPLANT
STAPLER VISISTAT 35W (STAPLE) IMPLANT
STRIP CLOSURE SKIN 1/2X4 (GAUZE/BANDAGES/DRESSINGS) IMPLANT
SUT MNCRL 0 VIOLET CTX 36 (SUTURE) ×4 IMPLANT
SUT MONOCRYL 0 CTX 36 (SUTURE) ×8
SUT PDS AB 0 CTX 60 (SUTURE) ×3 IMPLANT
SUT PLAIN 0 NONE (SUTURE) IMPLANT
SUT VIC AB 4-0 KS 27 (SUTURE) ×3 IMPLANT
SYR 20CC LL (SYRINGE) ×3 IMPLANT
TOWEL OR 17X24 6PK STRL BLUE (TOWEL DISPOSABLE) ×3 IMPLANT
TRAY FOLEY CATH 14FR (SET/KITS/TRAYS/PACK) ×3 IMPLANT
WATER STERILE IRR 1000ML POUR (IV SOLUTION) ×3 IMPLANT

## 2013-10-21 NOTE — Transfer of Care (Signed)
Immediate Anesthesia Transfer of Care Note  Patient: Sarah Sherman  Procedure(s) Performed: Procedure(s): CESAREAN SECTION (N/A)  Patient Location: PACU  Anesthesia Type:Spinal  Level of Consciousness: awake, alert , oriented and patient cooperative  Airway & Oxygen Therapy: Patient Spontanous Breathing  Post-op Assessment: Report given to PACU RN and Post -op Vital signs reviewed and stable  Post vital signs: Reviewed and stable  Complications: No apparent anesthesia complications

## 2013-10-21 NOTE — H&P (Signed)
Sarah Sherman is a 33 y.o. female presenting for contractions, no ROM. Previous cesarean section x 2 for repeat. Maternal Medical History:  Reason for admission: Contractions.   Contractions: Onset was 3-5 hours ago.    Fetal activity: Perceived fetal activity is normal.      OB History   Grav Para Term Preterm Abortions TAB SAB Ect Mult Living   5 2 2  2  2   2      Past Medical History  Diagnosis Date  . SAB (spontaneous abortion) 2010    x 2 - both in 2010  . HSV infection    Past Surgical History  Procedure Laterality Date  . Cesarean section  2011 and 2013    x 2 both in [place], [place]  . Wisdom tooth extraction     Family History: family history includes Cancer (age of onset: 37) in her maternal aunt; Diabetes in her maternal grandmother and mother; Hypertension in her maternal grandmother; Multiple sclerosis in her mother. Social History:  reports that she has never smoked. She has never used smokeless tobacco. She reports that she does not drink alcohol or use illicit drugs.   Prenatal Transfer Tool  Maternal Diabetes: No Genetic Screening: Normal Maternal Ultrasounds/Referrals: Normal Fetal Ultrasounds or other Referrals:  None Maternal Substance Abuse:  No Significant Maternal Medications:  None Significant Maternal Lab Results:  None Other Comments:  None  Review of Systems  Eyes: Negative for blurred vision.  Gastrointestinal: Negative for abdominal pain.  Neurological: Negative for headaches.    Dilation: 1.5 Effacement (%): 80 Station: -2 Exam by:: Jeannene Patella RN  Blood pressure 127/69, temperature 98.7 F (37.1 C), temperature source Oral, resp. rate 18, last menstrual period 01/15/2013. Maternal Exam:  Uterine Assessment: Contraction strength is firm.  Contraction frequency is irregular.   Abdomen: Patient reports no abdominal tenderness.   Fetal Exam Fetal State Assessment: Category I - tracings are normal.     Physical Exam   Cardiovascular: Normal rate.   Respiratory: Effort normal.  GI: Soft.  Neurological: She has normal reflexes.    Prenatal labs: ABO, Rh: --/--/O NEG (10/02 1400) Antibody: POS (10/02 1400) Rubella: 7.68 (05/06 1517) RPR: NON REAC (10/02 1345)  HBsAg: Negative (03/05 0000)  HIV: Non-reactive (03/05 0000)  GBS:     Assessment/Plan: 33 yo G5P2 at 71 1/7 weeks in early labor for repeat cesarean section D/W patient and husband procedure and risks including infection, organ damage, bleeding/transfusion-HIV/Hep, DVT/PE, pneumonia. All questions answered   [place] II,Sarah Sherman 10/21/2013, 8:49 PM

## 2013-10-21 NOTE — Brief Op Note (Signed)
10/21/2013  11:06 PM  PATIENT:  Julieta Bellini  33 y.o. female  PRE-OPERATIVE DIAGNOSIS:  previous cesarean section  POST-OPERATIVE DIAGNOSIS:  repeat cesarean section   PROCEDURE:  Procedure(s): CESAREAN SECTION (N/A)  SURGEON:  Surgeon(s) and Role:    * Allena Katz, MD - Primary  PHYSICIAN ASSISTANT:   ASSISTANTS: none   ANESTHESIA:   spinal  EBL:  Total I/O In: 2000 [I.V.:2000] Out: 1100 [Urine:350; Blood:750]  BLOOD ADMINISTERED:none  DRAINS: Urinary Catheter (Foley)   LOCAL MEDICATIONS USED:  OTHER 20 ml of Exparel in 20 ml of saline  SPECIMEN:  No Specimen  DISPOSITION OF SPECIMEN:  N/A  COUNTS:  YES  TOURNIQUET:  * No tourniquets in log *  DICTATION: .Other Dictation: Dictation Number [id number]  PLAN OF CARE: Admit to inpatient   PATIENT DISPOSITION:  PACU - hemodynamically stable.   Delay start of Pharmacological VTE agent (>24hrs) due to surgical blood loss or risk of bleeding: not applicable

## 2013-10-21 NOTE — Anesthesia Preprocedure Evaluation (Signed)
Anesthesia Evaluation  Patient identified by MRN, date of birth, ID band Patient awake    Reviewed: Allergy & Precautions, H&P , NPO status , Patient's Chart, lab work & pertinent test results  Airway Mallampati: III TM Distance: >3 FB Neck ROM: Full    Dental no notable dental hx. (+) Teeth Intact   Pulmonary neg pulmonary ROS,  breath sounds clear to auscultation  Pulmonary exam normal       Cardiovascular negative cardio ROS  Rhythm:Regular Rate:Normal     Neuro/Psych negative neurological ROS  negative psych ROS   GI/Hepatic Neg liver ROS, GERD-  Medicated and Controlled,  Endo/Other  Obesity  Renal/GU negative Renal ROS  negative genitourinary   Musculoskeletal negative musculoskeletal ROS (+)   Abdominal (+) + obese,   Peds  Hematology  (+) anemia ,   Anesthesia Other Findings   Reproductive/Obstetrics (+) Pregnancy Previous C/Section x 2                           Anesthesia Physical Anesthesia Plan  ASA: II and emergent  Anesthesia Plan: Spinal   Post-op Pain Management:    Induction:   Airway Management Planned: Natural Airway  Additional Equipment:   Intra-op Plan:   Post-operative Plan:   Informed Consent: I have reviewed the patients History and Physical, chart, labs and discussed the procedure including the risks, benefits and alternatives for the proposed anesthesia with the patient or authorized representative who has indicated his/her understanding and acceptance.     Plan Discussed with: Anesthesiologist, CRNA and Surgeon  Anesthesia Plan Comments:         Anesthesia Quick Evaluation

## 2013-10-21 NOTE — Progress Notes (Signed)
Off monitors to OR

## 2013-10-21 NOTE — Anesthesia Procedure Notes (Signed)
Spinal  Patient location during procedure: OR Start time: 10/21/2013 10:08 PM Staffing Anesthesiologist: Brooklen Runquist A. Performed by: anesthesiologist  Preanesthetic Checklist Completed: patient identified, site marked, surgical consent, pre-op evaluation, timeout performed, IV checked, risks and benefits discussed and monitors and equipment checked Spinal Block Patient position: sitting Prep: site prepped and draped and DuraPrep Patient monitoring: heart rate, cardiac monitor, continuous pulse ox and blood pressure Approach: midline Location: L3-4 Injection technique: single-shot Needle Needle type: Sprotte  Needle gauge: 24 G Needle length: 9 cm Needle insertion depth: 5 cm Assessment Sensory level: T4 Additional Notes Patient tolerated procedure well. Adequate sensory level.

## 2013-10-21 NOTE — Op Note (Signed)
Sarah Sherman, Sarah Sherman           ACCOUNT NO.:  [id number]  MEDICAL RECORD NO.:  [id number]  LOCATION:  [facility]                          FACILITY:  [facility]  PHYSICIAN:  Daleen Bo. Gaetano Net, M.D. DATE OF BIRTH:  Nov 25, 1980  DATE OF PROCEDURE:  10/21/2013 DATE OF DISCHARGE:                              OPERATIVE REPORT   PREOPERATIVE DIAGNOSES: 1. Intrauterine pregnancy at 39-1/7th weeks estimated gestational age. 2. Previous cesarean section x2, desires repeat. 3. Labor.  POSTOPERATIVE DIAGNOSES: 1. Intrauterine pregnancy at 39-1/7th weeks estimated gestational age. 2. Previous cesarean section x2. 3. Labor.  SURGEON:  Daleen Bo. Gaetano Net, M.D.  ANESTHESIA:  Spinal, Josephine Igo, MD.  ESTIMATED BLOOD LOSS:  750 mL.  PROCEDURE:  Repeat low transverse cesarean section.  FINDINGS:  Viable female infant, Apgars of 8 and 9 at one and five minutes respectively.  Birth weight and arterial cord pH pending.  INDICATIONS AND CONSENT:  This patient is a 33 year old married African American female, G5, P2 at 39-1/7th weeks with 2 previous cesarean sections.  She is having regular firm contractions.  Cesarean section was discussed with the patient.  Potential risks and complications were discussed preoperatively with the patient and her husband including but not limited to, infection, organ damage, bleeding requiring transfusion of blood products with HIV and hepatitis acquisition, DVT, PE, pneumonia.  All questions were answered and consent was signed on the chart.  PROCEDURE:  The patient was taken to the operating room.  She was identified.  Spinal anesthetic was placed per Dr. Royce Macadamia, and she was placed in a dorsal supine position with a 15-degree left lateral wedge. Foley catheter was placed.  She was prepped and draped in a sterile fashion per [facility] protocol.  Time-out undertaken.  After testing for adequate spinal anesthesia, skin was entered through the previous  Pfannenstiel scar and dissection was carried out in layers to the peritoneum.  Peritoneum was incised and extended superiorly and inferiorly.  Vesicouterine peritoneum was taken down cephalad laterally. The bladder flap was developed.  There were some dense adhesions of the bladder but these were taken down successfully.  The lower uterine segment was incised in a transverse fashion.  The uterine cavity was entered bluntly with a hemostat.  The uterine incision was extended cephalad laterally with fingers.  Artificial rupture of membranes for clear fluid was carried out.  The vertex was then delivered with the aid of a vacuum extractor to gently lift the vertex through the incision. Then, the baby was delivered without difficulty.  Good cry and tone were noted.  Cord was clamped and cut.  The baby was handed to the awaiting pediatrics team.  Placenta was manually delivered.  The cavity was clean.  Uterus was closed in 2 running locking imbricating layers of 0 Monocryl suture which achieves good hemostasis.  Anterior peritoneum was closed in a running fashion with 0 Monocryl suture which was also used to reapproximate the pyramidalis muscle in the midline.  Anterior rectus fascia was closed in a running fashion with a 0 looped PDS suture.  The subcutaneous and subfascial planes were both injected with 20 mL of Exparel diluted in 20 mL of saline.  The subcutaneous tissues were reapproximated  with interrupted plain suture and the skin was closed with a subcuticular Vicryl on a Keith needle.  Steri-Strips were applied.  Dressings were applied.  All counts were correct.  The patient was taken to recovery room in stable condition.     Daleen Bo Gaetano Net, M.D.     JET/MEDQ  D:  10/21/2013  T:  10/21/2013  Job:  [id number]

## 2013-10-21 NOTE — MAU Note (Signed)
Pt states that contractions started 5-10 minutes apart around 12noon. Denies vaginal bleeding. Denies SROM or Leaking of fluid. States positive fetal movement. Denies any risk factors. Does not know GBS status.

## 2013-10-22 ENCOUNTER — Encounter (HOSPITAL_COMMUNITY): Payer: Self-pay | Admitting: *Deleted

## 2013-10-22 ENCOUNTER — Encounter (HOSPITAL_COMMUNITY)
Admission: AD | Disposition: A | Discharge status: Home / Self Care | Payer: Self-pay | Source: Ambulatory Visit | Attending: Obstetrics and Gynecology

## 2013-10-22 ENCOUNTER — Inpatient Hospital Stay (HOSPITAL_COMMUNITY): Admission: RE | Admit: 2013-10-22 | Payer: 59 | Source: Ambulatory Visit | Admitting: Obstetrics & Gynecology

## 2013-10-22 DIAGNOSIS — Z98891 History of uterine scar from previous surgery: Secondary | ICD-10-CM

## 2013-10-22 LAB — KLEIHAUER-BETKE STAIN
# VIALS RHIG: 1
Fetal Cells %: 0 %
QUANTITATION FETAL HEMOGLOBIN: 0 mL

## 2013-10-22 LAB — CBC
HCT: 30.9 % — ABNORMAL LOW (ref 36.0–46.0)
Hemoglobin: 9.8 g/dL — ABNORMAL LOW (ref 12.0–15.0)
MCH: 29.9 pg (ref 26.0–34.0)
MCHC: 31.7 g/dL (ref 30.0–36.0)
MCV: 94.2 fL (ref 78.0–100.0)
Platelets: 157 10*3/uL (ref 150–400)
RBC: 3.28 MIL/uL — ABNORMAL LOW (ref 3.87–5.11)
RDW: 13.6 % (ref 11.5–15.5)
WBC: 11 10*3/uL — AB (ref 4.0–10.5)

## 2013-10-22 SURGERY — Surgical Case
Anesthesia: Regional

## 2013-10-22 MED ORDER — DIPHENHYDRAMINE HCL 25 MG PO CAPS: 25.0000 mg | ORAL_CAPSULE | ORAL | Status: DC | PRN

## 2013-10-22 MED ORDER — ONDANSETRON HCL 4 MG PO TABS
4.0000 mg | ORAL_TABLET | ORAL | Status: DC | PRN
Start: 2013-10-22 — End: 2013-10-23

## 2013-10-22 MED ORDER — OXYTOCIN 40 UNITS IN LACTATED RINGERS INFUSION - SIMPLE MED: 62.5000 mL/h | INTRAVENOUS | Status: AC

## 2013-10-22 MED ORDER — ZOLPIDEM TARTRATE 5 MG PO TABS
5.0000 mg | ORAL_TABLET | Freq: Every evening | ORAL | Status: DC | PRN
Start: 2013-10-22 — End: 2013-10-23

## 2013-10-22 MED ORDER — KETOROLAC TROMETHAMINE 30 MG/ML IJ SOLN: 30.0000 mg | Freq: Four times a day (QID) | INTRAMUSCULAR | Status: AC | PRN

## 2013-10-22 MED ORDER — OXYCODONE-ACETAMINOPHEN 5-325 MG PO TABS: 2.0000 | ORAL_TABLET | ORAL | Status: DC | PRN

## 2013-10-22 MED ORDER — LANOLIN HYDROUS EX OINT: 1.0000 "application " | TOPICAL_OINTMENT | CUTANEOUS | Status: DC | PRN

## 2013-10-22 MED ORDER — TETANUS-DIPHTH-ACELL PERTUSSIS 5-2.5-18.5 LF-MCG/0.5 IM SUSP: 0.5000 mL | Freq: Once | INTRAMUSCULAR | Status: DC

## 2013-10-22 MED ORDER — DIPHENHYDRAMINE HCL 50 MG/ML IJ SOLN: 12.5000 mg | INTRAMUSCULAR | Status: DC | PRN

## 2013-10-22 MED ORDER — RHO D IMMUNE GLOBULIN 1500 UNIT/2ML IJ SOSY
300.0000 ug | PREFILLED_SYRINGE | Freq: Once | INTRAMUSCULAR | Status: AC
  Administered 2013-10-22: 300 ug via INTRAVENOUS
  Filled 2013-10-22: qty 2

## 2013-10-22 MED ORDER — MEPERIDINE HCL 25 MG/ML IJ SOLN: 6.2500 mg | INTRAMUSCULAR | Status: DC | PRN

## 2013-10-22 MED ORDER — KETOROLAC TROMETHAMINE 30 MG/ML IJ SOLN
INTRAMUSCULAR | Status: AC
  Filled 2013-10-22: qty 1

## 2013-10-22 MED ORDER — SCOPOLAMINE 1 MG/3DAYS TD PT72
1.0000 | MEDICATED_PATCH | Freq: Once | TRANSDERMAL | Status: DC
  Administered 2013-10-22: 1.5 mg via TRANSDERMAL

## 2013-10-22 MED ORDER — SIMETHICONE 80 MG PO CHEW
80.0000 mg | CHEWABLE_TABLET | ORAL | Status: DC
  Administered 2013-10-23: 80 mg via ORAL
  Filled 2013-10-22: qty 1

## 2013-10-22 MED ORDER — NALOXONE HCL 1 MG/ML IJ SOLN: 1.0000 ug/kg/h | INTRAVENOUS | Status: DC | PRN

## 2013-10-22 MED ORDER — IBUPROFEN 600 MG PO TABS
600.0000 mg | ORAL_TABLET | Freq: Four times a day (QID) | ORAL | Status: DC
Start: 2013-10-22 — End: 2013-10-23
  Administered 2013-10-22 – 2013-10-23 (×6): 600 mg via ORAL
  Filled 2013-10-22 (×6): qty 1

## 2013-10-22 MED ORDER — NALBUPHINE HCL 10 MG/ML IJ SOLN: 5.0000 mg | Freq: Once | INTRAMUSCULAR | Status: AC | PRN

## 2013-10-22 MED ORDER — WITCH HAZEL-GLYCERIN EX PADS
1.0000 | MEDICATED_PAD | CUTANEOUS | Status: DC | PRN
Start: 2013-10-22 — End: 2013-10-23

## 2013-10-22 MED ORDER — LACTATED RINGERS IV SOLN
INTRAVENOUS | Status: DC
  Administered 2013-10-22: 03:00:00 via INTRAVENOUS

## 2013-10-22 MED ORDER — NALOXONE HCL 0.4 MG/ML IJ SOLN: 0.4000 mg | INTRAMUSCULAR | Status: DC | PRN

## 2013-10-22 MED ORDER — SODIUM CHLORIDE 0.9 % IJ SOLN: 3.0000 mL | INTRAMUSCULAR | Status: DC | PRN

## 2013-10-22 MED ORDER — NALBUPHINE HCL 10 MG/ML IJ SOLN: 5.0000 mg | INTRAMUSCULAR | Status: DC | PRN

## 2013-10-22 MED ORDER — MENTHOL 3 MG MT LOZG
1.0000 | LOZENGE | OROMUCOSAL | Status: DC | PRN
Start: 2013-10-22 — End: 2013-10-23

## 2013-10-22 MED ORDER — FENTANYL CITRATE 0.05 MG/ML IJ SOLN: 25.0000 ug | INTRAMUSCULAR | Status: DC | PRN

## 2013-10-22 MED ORDER — ONDANSETRON HCL 4 MG/2ML IJ SOLN
4.0000 mg | INTRAMUSCULAR | Status: DC | PRN
Start: 2013-10-22 — End: 2013-10-23

## 2013-10-22 MED ORDER — SIMETHICONE 80 MG PO CHEW
80.0000 mg | CHEWABLE_TABLET | ORAL | Status: DC | PRN
Start: 2013-10-22 — End: 2013-10-23

## 2013-10-22 MED ORDER — PRENATAL MULTIVITAMIN CH
1.0000 | ORAL_TABLET | Freq: Every day | ORAL | Status: DC
  Administered 2013-10-22: 1 via ORAL
  Filled 2013-10-22: qty 1

## 2013-10-22 MED ORDER — KETOROLAC TROMETHAMINE 30 MG/ML IJ SOLN
30.0000 mg | Freq: Four times a day (QID) | INTRAMUSCULAR | Status: AC | PRN
  Administered 2013-10-22: 30 mg via INTRAVENOUS

## 2013-10-22 MED ORDER — SIMETHICONE 80 MG PO CHEW
80.0000 mg | CHEWABLE_TABLET | Freq: Three times a day (TID) | ORAL | Status: DC
  Administered 2013-10-22 – 2013-10-23 (×3): 80 mg via ORAL
  Filled 2013-10-22 (×4): qty 1

## 2013-10-22 MED ORDER — DIPHENHYDRAMINE HCL 25 MG PO CAPS: 25.0000 mg | ORAL_CAPSULE | Freq: Four times a day (QID) | ORAL | Status: DC | PRN

## 2013-10-22 MED ORDER — SCOPOLAMINE 1 MG/3DAYS TD PT72
MEDICATED_PATCH | TRANSDERMAL | Status: AC
  Filled 2013-10-22: qty 1

## 2013-10-22 MED ORDER — DIBUCAINE 1 % RE OINT
1.0000 | TOPICAL_OINTMENT | RECTAL | Status: DC | PRN
Start: 2013-10-22 — End: 2013-10-23

## 2013-10-22 MED ORDER — SENNOSIDES-DOCUSATE SODIUM 8.6-50 MG PO TABS
2.0000 | ORAL_TABLET | ORAL | Status: DC
  Administered 2013-10-23: 2 via ORAL
  Filled 2013-10-22: qty 2

## 2013-10-22 MED ORDER — ONDANSETRON HCL 4 MG/2ML IJ SOLN: 4.0000 mg | Freq: Three times a day (TID) | INTRAMUSCULAR | Status: DC | PRN

## 2013-10-22 MED ORDER — OXYCODONE-ACETAMINOPHEN 5-325 MG PO TABS
1.0000 | ORAL_TABLET | ORAL | Status: DC | PRN
  Administered 2013-10-23 (×2): 1 via ORAL
  Filled 2013-10-22 (×2): qty 1

## 2013-10-22 SURGICAL SUPPLY — 33 items
CLAMP CORD UMBIL (MISCELLANEOUS) IMPLANT
CLOTH BEACON ORANGE TIMEOUT ST (SAFETY) IMPLANT
COVER LIGHT HANDLE  1/PK (MISCELLANEOUS)
COVER LIGHT HANDLE 1/PK (MISCELLANEOUS) IMPLANT
DERMABOND ADVANCED (GAUZE/BANDAGES/DRESSINGS)
DERMABOND ADVANCED .7 DNX12 (GAUZE/BANDAGES/DRESSINGS) IMPLANT
DRAPE SHEET LG 3/4 BI-LAMINATE (DRAPES) IMPLANT
DRSG OPSITE POSTOP 4X10 (GAUZE/BANDAGES/DRESSINGS) IMPLANT
DURAPREP 26ML APPLICATOR (WOUND CARE) IMPLANT
ELECT REM PT RETURN 9FT ADLT (ELECTROSURGICAL)
ELECTRODE REM PT RTRN 9FT ADLT (ELECTROSURGICAL) IMPLANT
EXTRACTOR VACUUM KIWI (MISCELLANEOUS) IMPLANT
EXTRACTOR VACUUM M CUP 4 TUBE (SUCTIONS) IMPLANT
EXTRACTOR VACUUM M CUP 4' TUBE (SUCTIONS)
GLOVE BIO SURGEON STRL SZ 6 (GLOVE) IMPLANT
GLOVE BIOGEL PI IND STRL 6 (GLOVE) IMPLANT
GLOVE BIOGEL PI INDICATOR 6 (GLOVE)
GOWN STRL REUS W/TWL LRG LVL3 (GOWN DISPOSABLE) IMPLANT
KIT ABG SYR 3ML LUER SLIP (SYRINGE) IMPLANT
NEEDLE HYPO 25X5/8 SAFETYGLIDE (NEEDLE) IMPLANT
NS IRRIG 1000ML POUR BTL (IV SOLUTION) IMPLANT
PACK C SECTION WH (CUSTOM PROCEDURE TRAY) IMPLANT
PAD OB MATERNITY 4.3X12.25 (PERSONAL CARE ITEMS) IMPLANT
STAPLER VISISTAT 35W (STAPLE) IMPLANT
SUT CHROMIC 0 CTX 36 (SUTURE) IMPLANT
SUT MON AB 2-0 CT1 27 (SUTURE) IMPLANT
SUT PDS AB 0 CT1 27 (SUTURE) IMPLANT
SUT PLAIN 0 NONE (SUTURE) IMPLANT
SUT VIC AB 0 CT1 36 (SUTURE) IMPLANT
SUT VIC AB 4-0 KS 27 (SUTURE) IMPLANT
TOWEL OR 17X24 6PK STRL BLUE (TOWEL DISPOSABLE) IMPLANT
TRAY FOLEY CATH 14FR (SET/KITS/TRAYS/PACK) IMPLANT
WATER STERILE IRR 1000ML POUR (IV SOLUTION) IMPLANT

## 2013-10-22 NOTE — Progress Notes (Signed)
Subjective: Postpartum Day 1: Cesarean Delivery Patient reports tolerating PO.    Objective: Vital signs in last 24 hours: Temp:  [97.9 F (36.6 C)-98.9 F (37.2 C)] 98.3 F (36.8 C) (10/05 0817) Pulse Rate:  [64-85] 68 (10/05 0817) Resp:  [16-24] 18 (10/05 0817) BP: (97-127)/(49-69) 111/57 mmHg (10/05 0817) SpO2:  [93 %-100 %] 99 % (10/05 0817) Weight:  [205 lb (92.987 kg)] 205 lb (92.987 kg) (10/05 3716)  Physical Exam:  General: alert and cooperative Lochia: appropriate Uterine Fundus: firm Incision: honeycomb dressing CDI DVT Evaluation: No evidence of DVT seen on physical exam. Negative Homan's sign. No cords or calf tenderness. No significant calf/ankle edema.   Recent Labs  10/21/13 2105 10/22/13 0600  HGB 11.3* 9.8*  HCT 34.8* 30.9*    Assessment/Plan: Status post Cesarean section. Doing well postoperatively.  Continue current care.  Thi Klich G 10/22/2013, 8:23 AM

## 2013-10-22 NOTE — Addendum Note (Signed)
Addendum created 10/22/13 0858 by Jonna Munro, CRNA   Modules edited: Notes Section   Notes Section:  File: [id number]

## 2013-10-22 NOTE — Anesthesia Postprocedure Evaluation (Signed)
  Anesthesia Post-op Note  Patient: Sarah Sherman  Procedure(s) Performed: Procedure(s): CESAREAN SECTION (N/A)  Patient Location: Mother/Baby  Anesthesia Type:Spinal  Level of Consciousness: awake, alert  and oriented  Airway and Oxygen Therapy: Patient Spontanous Breathing  Post-op Pain: none  Post-op Assessment: Post-op Vital signs reviewed, Patient's Cardiovascular Status Stable, Respiratory Function Stable, No signs of Nausea or vomiting, Pain level controlled, No headache, No backache, No residual numbness and No residual motor weakness  Post-op Vital Signs: Reviewed and stable  Last Vitals:  Filed Vitals:   10/22/13 0817  BP: 111/57  Pulse: 68  Temp: 36.8 C  Resp: 18    Complications: No apparent anesthesia complications

## 2013-10-22 NOTE — Anesthesia Postprocedure Evaluation (Signed)
  Anesthesia Post-op Note  Patient: Sarah Sherman  Procedure(s) Performed: Procedure(s): CESAREAN SECTION (N/A)  Patient Location: PACU  Anesthesia Type:Spinal  Level of Consciousness: awake, alert  and oriented  Airway and Oxygen Therapy: Patient Spontanous Breathing  Post-op Pain: none  Post-op Assessment: Post-op Vital signs reviewed, Patient's Cardiovascular Status Stable, Respiratory Function Stable, Patent Airway, No signs of Nausea or vomiting, Pain level controlled, No headache, No backache and No residual numbness  Post-op Vital Signs: Reviewed and stable  Last Vitals:  Filed Vitals:   10/22/13 0045  BP: 122/66  Pulse: 70  Temp: 37.1 C  Resp: 19    Complications: No apparent anesthesia complications

## 2013-10-22 NOTE — Lactation Note (Signed)
This note was copied from the chart of [name]. Lactation Consultation Note: Initial visit with this experienced BF mom. Baby now 60 hours old. Reports that baby has latched well but has been sleepy. Attempted to latch- undressed and placed skin to skin with mom. Too sleepy to latch. Reviewed feeding cues and encouraged to feed whenever she sees them. BF brochure given with resources for support after DC. No questions at present, To call for assist prn  Patient Name: Sarah Sherman [id number] Date: 10/22/2013 Reason for consult: Initial assessment   Maternal Data Formula Feeding for Exclusion: No Does the patient have breastfeeding experience prior to this delivery?: Yes  Feeding Feeding Type: Breast Fed  LATCH Score/Interventions Latch: Too sleepy or reluctant, no latch achieved, no sucking elicited.  Audible Swallowing: None Intervention(s): Skin to skin  Type of Nipple: Everted at rest and after stimulation  Comfort (Breast/Nipple): Soft / non-tender     Hold (Positioning): No assistance needed to correctly position infant at breast.  LATCH Score: 6  Lactation Tools Discussed/Used     Consult Status Consult Status: Follow-up Date: 10/23/13 Follow-up type: In-patient    Truddie Crumble 10/22/2013, 11:08 AM

## 2013-10-23 LAB — TYPE AND SCREEN
ABO/RH(D): O NEG
ANTIBODY SCREEN: POSITIVE
DAT, IgG: NEGATIVE
UNIT DIVISION: 0
Unit division: 0

## 2013-10-23 LAB — RH IG WORKUP (INCLUDES ABO/RH)
ABO/RH(D): O NEG
Gestational Age(Wks): 39
Unit division: 0

## 2013-10-23 LAB — BIRTH TISSUE RECOVERY COLLECTION (PLACENTA DONATION)

## 2013-10-23 MED ORDER — OXYCODONE-ACETAMINOPHEN 5-325 MG PO TABS: 1.0000 | ORAL_TABLET | ORAL | Status: DC | PRN

## 2013-10-23 MED ORDER — IBUPROFEN 600 MG PO TABS: 600.0000 mg | ORAL_TABLET | Freq: Four times a day (QID) | ORAL | Status: DC

## 2013-10-23 NOTE — Discharge Summary (Signed)
Obstetric Discharge Summary Reason for Admission: onset of labor Prenatal Procedures: ultrasound Intrapartum Procedures: cesarean: low cervical, transverse Postpartum Procedures: none Complications-Operative and Postpartum: none Hemoglobin  Date Value Ref Range Status  10/22/2013 9.8* 12.0 - 15.0 g/dL Final     HCT  Date Value Ref Range Status  10/22/2013 30.9* 36.0 - 46.0 % Final    Physical Exam:  General: alert and cooperative Lochia: appropriate Uterine Fundus: firm Incision: healing well DVT Evaluation: No evidence of DVT seen on physical exam. Negative Homan's sign. No cords or calf tenderness. No significant calf/ankle edema.  Discharge Diagnoses: Term Pregnancy-delivered  Discharge Information: Date: 10/23/2013 Activity: pelvic rest Diet: routine Medications: PNV, Ibuprofen and Percocet Condition: stable Instructions: refer to practice specific booklet Discharge to: home   Newborn Data: Live born female  Birth Weight: 8 lb 6.6 oz (3815 g) APGAR: 8, 9  Home with mother.  Wynetta Seith G 10/23/2013, 8:27 AM

## 2013-10-25 ENCOUNTER — Inpatient Hospital Stay (HOSPITAL_COMMUNITY)
Admission: AD | Admit: 2013-10-25 | Discharge: 2013-10-25 | Discharge status: Against Medical Advice | Payer: 59 | Source: Ambulatory Visit | Attending: Obstetrics and Gynecology | Admitting: Obstetrics and Gynecology

## 2013-10-25 NOTE — MAU Note (Signed)
Pt not in lobby at [facility] per [place]

## 2013-10-25 NOTE — MAU Note (Signed)
Patient is not in the lobby when called to triage.

## 2013-10-25 NOTE — MAU Note (Signed)
Pt not in lobby.  

## 2013-11-05 ENCOUNTER — Ambulatory Visit (HOSPITAL_COMMUNITY)
Admission: RE | Admit: 2013-11-05 | Discharge: 2013-11-05 | Disposition: A | Discharge status: Home / Self Care | Payer: 59 | Source: Ambulatory Visit | Attending: Family Medicine | Admitting: Family Medicine

## 2013-11-05 ENCOUNTER — Encounter (HOSPITAL_COMMUNITY): Payer: 59

## 2013-11-05 NOTE — Lactation Note (Signed)
Lactation Consult  Mother's reason for visit: Mom is concerned that baby is now two weeks old, and just up to birth weight, mom has extremly sore nipples from breast feeding, and concerns that baby's tongue /latch does not feel right, different from her other children  Visit Type:  Outpatient Lactation Appointment Notes:  mom with excoriated nipples, nipple stripes, using all purpose nipple cream Consult:  Follow-Up Lactation Consultant:  Sarah Sherman  ________________________________________________________________________   Full term baby, Sarah Sherman, born by c-section on 10/21/2012  at 21 1/[redacted] weeks gestation, now 65 days old. Birth weight was 8 lbs 6.6 ounces. Discharge weight on 10/6 was 7 lbs 14 ounces (6.2 % weight loss), and today's weight is 7 lbs 15.8 ounces.  Mom is concerned that Sarah Sherman is still not up to birth weight, and is 1 day past 58 weeks old.   ________________________________________________________________________  Mother's Name: Sarah Sherman Type of delivery:  c-section Breastfeeding Experience: mom breast fed her 2 other children for 2-3 months Maternal Medical Conditions:  none Maternal Medications:  none  ________________________________________________________________________  Breastfeeding History (Post Discharge)  Frequency of breastfeeding:  Every 2-3 hours Duration of feeding:  20-30 minutes    Pumping  Type of pump:  DEP Frequency:  A few times a day, when her nipples are too sore to breast feed Volume:  30-60 ml  Infant Intake and Output Assessment  Voids:  6-8 in 24 hrs.  Color:  Clear yellow Stools: 0- 1, with a smear in most diapers in 24 hrs.  Color:  unknown  ________________________________________________________________________  Maternal Breast Assessment  Breast:  Filling, Compressible and escoriated, nipple sucking stripes from improper, shallow latch, extremely painful as per mom Nipple:  Erect Pain level:  10    Left worse than right Pain interventions:  All purpose nipple cream and mom is just pumping left breast due to breakdown and pain  _______________________________________________________________________ Feeding Assessment/Evaluation    Sarah Sherman would not latch deeply - she kept her nose away from the breast, and when mom would try to move her closer, she would pull away. A deep latch did not seem comfortable for her. On exam of baby's mouth, she has an upper lip frenulum that extends to the gum line. Mom has 3 people on her side of the family with a space between their front teeth. Often associated with this is a tongue frenulum, that it's position or length is such that it impedes breast feeding. Sarah Sherman has a frenulum that is posterior, in thick tissue, and the frenulum blanches with tongue elevation. She can extend her tongue over her gum line, but tends to hump her tongue toward the back of her mouth, both with crying and with breastfeeding, as per mom. I feel this is both the cause of her slow weight gain, and mom's sore, excoriated nipples. I advised mom to speak to her pediatrician about the above, and see if they agree . Sarah Sherman transferred 28 mls from one breast, but was clearly still showing hunger cues. Mom has been able to express 1-2 ounces of EBM after breast feeding, which she then feeds to [name]. If the baby does not transfer milk well from mom, mom will quickly lose her milk supply, again adding to poor weight gain. Mom has decided to stop putting Sarah Sherman to breast at this time, and will pump and bottle feed EBM . She would like to continue breast feeding, if possible.  Initial feeding assessment:  Infant's oral assessment:  Variance  Positioning:  Cross cradle Right breast  LATCH documentation:  Latch:  1 = Repeated attempts needed to sustain latch, nipple held in mouth throughout feeding, stimulation needed to elicit sucking reflex.  Audible swallowing:  2 = Spontaneous and  intermittent  Type of nipple:  2 = Everted at rest and after stimulation  Comfort (Breast/Nipple):  1 = Filling, red/small blisters or bruises, mild/mod discomfort  Hold (Positioning):  1 = Assistance needed to correctly position infant at breast and maintain latch  LATCH score: 7  Attached assessment:  Shallow  Lips flanged:  No.  Lips untucked:  Yes.    Suck assessment:  Displays both  Tools:  Nipple shield 24 mmbaby not transferring - more shallow Instructed on use and cleaning of tool:  Yes.    Pre-feed weight:  3624 g  7 lb. 15.8oz.) Post-feed weight:  3652g  8 lb 0..8 oz.) Amount transferred: 85ml Amount supplemented: 0 ml     Mom to go home and pump and supplement  baby with EBM by bottle  No  Total amount pumped post feed:  R 0 ml    L 0 ml  Total amount transferred:  28 ml Total supplement given:  0 ml

## 2013-11-12 ENCOUNTER — Ambulatory Visit (HOSPITAL_COMMUNITY)
Admission: RE | Admit: 2013-11-12 | Discharge: 2013-11-12 | Disposition: A | Discharge status: Home / Self Care | Payer: 59 | Source: Ambulatory Visit | Attending: Pediatrics | Admitting: Pediatrics

## 2013-11-12 NOTE — Lactation Note (Signed)
Lactation Consult  Mother's reason for visit:  FOllow-up Visit Type:  OP Appointment Notes:  Mom's nipples are healing and Sarah Sherman is gaining but is being bottle fed expressed breast milk and formula several times a day.  Mom's milk supply is decreasing.  Sarah Sherman has been latching with a NS related to mom's SN but now that they are healed she would like to try latching with out it. Sarah Sherman latched easily using an asymmetrical latch and transferred 40 ml in 20 minutes.  She was placed on the left breast and only transferred 2 ml in the same amount of time. We placed her back on the right side and she transferred an additional 20 ml for a total of 62 ml. She fed better in a football hold.  Sarah Sherman breaks the suction and snapback is heard even when feeding with a bottle.  She has difficulty keeping her lips flanged and maintaining a deep latch.  I noticed that she tends to look to her left side so I talked to mom about performing gentle ROM exercises.  Limited ROM can also affect BF.   I also recommended that she look into CST, PT or chiropractic using someone who specializes in infants. Plan for now is to feed Sarah Sherman and increase mom's milk supply.  Mom may decide to pump and bottle feed. Consult:   Lactation Consultant:  Van Clines  ________________________________________________________________________  Sarah Sherman Name: Sarah Sherman  Date of Birth: 10/21/2012  Pediatrician: dees  Gender: female  Gestational Age: [redacted]w[redacted]d (At Birth)  Birth Weight: 8 lb 6.6 oz (3815 g)  Weight at Discharge: Weight: 7 lb 14.3 oz (3580 g) Date of Discharge: 10/23/2013  [facility] Weights   10/21/13 2230 10/23/13 0007  Weight: 8 lb 6.6 oz (3815 g) 7 lb 14.3 oz (3580 g)  [facility] weight taken from [facility]:  Location:  Weight today: 8+9.9   ________________________________________________________________________  Mother's Name: Sarah Sherman ________________________________________________________________________  Infant Intake and Output Assessment  Voids:  7-8 in 24 hrs.    Stools:  2-3  in 24 hrs.    ________________________________________________________________________  _______________________________________________________________________ Feeding Assessment/Evaluation  Initial feeding assessment:  I

## 2013-11-19 ENCOUNTER — Encounter (HOSPITAL_COMMUNITY): Payer: Self-pay | Admitting: *Deleted

## 2014-01-31 ENCOUNTER — Encounter (HOSPITAL_COMMUNITY): Payer: Self-pay | Admitting: Obstetrics & Gynecology

## 2014-02-15 ENCOUNTER — Ambulatory Visit (INDEPENDENT_AMBULATORY_CARE_PROVIDER_SITE_OTHER): Payer: 59 | Admitting: Family Medicine

## 2014-02-15 ENCOUNTER — Encounter: Payer: Self-pay | Admitting: Family Medicine

## 2014-02-15 VITALS — BP 120/80 | HR 86 | Temp 98.3°F | Wt 185.0 lb

## 2014-02-15 DIAGNOSIS — A499 Bacterial infection, unspecified: Secondary | ICD-10-CM

## 2014-02-15 DIAGNOSIS — H109 Unspecified conjunctivitis: Secondary | ICD-10-CM

## 2014-02-15 DIAGNOSIS — H1089 Other conjunctivitis: Secondary | ICD-10-CM

## 2014-02-15 MED ORDER — POLYMYXIN B-TRIMETHOPRIM 10000-0.1 UNIT/ML-% OP SOLN: 2.0000 [drp] | OPHTHALMIC | Status: DC

## 2014-02-15 NOTE — Progress Notes (Signed)
Pre visit review using our clinic review tool, if applicable. No additional management support is needed unless otherwise documented below in the visit note. 

## 2014-02-15 NOTE — Patient Instructions (Signed)

## 2014-02-15 NOTE — Progress Notes (Signed)
   Subjective:    Patient ID: Sarah Sherman, female    DOB: 12-26-1980, 34 y.o.   MRN: [id number]  HPI Red eye one day duration. She's had some drainage which has been yellow to greenish in color and had some matting over the past couple days. Denies any pain. She has some itching. No blurred vision. Minimal nasal congestion. No contact use. Denies any right eye symptoms. No known drug allergies  Past Medical History  Diagnosis Date  . SAB (spontaneous abortion) 2010    x 2 - both in 2010  . HSV infection    Past Surgical History  Procedure Laterality Date  . Cesarean section  2011 and 2013    x 2 both in [place], [place]  . Wisdom tooth extraction    . Cesarean section N/A 10/21/2013    Procedure: CESAREAN SECTION;  Surgeon: Allena Katz, MD;  Location: [facility];  Service: Obstetrics;  Laterality: N/A;    reports that she has never smoked. She has never used smokeless tobacco. She reports that she does not drink alcohol or use illicit drugs. family history includes Cancer (age of onset: 54) in her maternal aunt; Diabetes in her maternal grandmother and mother; Hypertension in her maternal grandmother; Multiple sclerosis in her mother. No Known Allergies    Review of Systems  Constitutional: Negative for fever and chills.  Eyes: Positive for discharge, redness and itching. Negative for photophobia, pain and visual disturbance.  Neurological: Negative for headaches.       Objective:   Physical Exam  Constitutional: She appears well-developed and well-nourished.  HENT:  Right Ear: External ear normal.  Left Ear: External ear normal.  Mouth/Throat: Oropharynx is clear and moist.  Eyes: Pupils are equal, round, and reactive to light.  Right conjunctivae normal. Left conjunctiva is very erythematous. She has little bit of yellowish drainage from inner canthus. Cornea appears normal  Neck: Neck supple.  Cardiovascular: Normal rate and regular rhythm.   Lymphadenopathy:   She has no cervical adenopathy.          Assessment & Plan:  Conjunctivitis, left eye. Suspect bacterial. Continue warm compresses. Polytrim ophthalmic drops every 4 hours while awake

## 2014-03-06 ENCOUNTER — Encounter: Payer: Self-pay | Admitting: Family Medicine

## 2014-03-06 ENCOUNTER — Ambulatory Visit (INDEPENDENT_AMBULATORY_CARE_PROVIDER_SITE_OTHER): Payer: 59 | Admitting: Family Medicine

## 2014-03-06 VITALS — BP 117/72 | HR 82 | Temp 99.1°F | Ht 68.0 in | Wt 184.0 lb

## 2014-03-06 DIAGNOSIS — J0100 Acute maxillary sinusitis, unspecified: Secondary | ICD-10-CM

## 2014-03-06 MED ORDER — FLUTICASONE PROPIONATE 50 MCG/ACT NA SUSP: 2.0000 | Freq: Every day | NASAL | Status: DC

## 2014-03-06 MED ORDER — AMOXICILLIN 875 MG PO TABS: 875.0000 mg | ORAL_TABLET | Freq: Two times a day (BID) | ORAL | Status: DC

## 2014-03-06 NOTE — Progress Notes (Signed)
Pre visit review using our clinic review tool, if applicable. No additional management support is needed unless otherwise documented below in the visit note. 

## 2014-03-06 NOTE — Progress Notes (Signed)
   Subjective:    Patient ID: Sarah Sherman, female    DOB: 07-17-1980, 34 y.o.   MRN: [id number]  HPI Here for 3 weeks of sinus pressure, PND, left ear pain and a ST. No fever or cough. She is breast feeding currently. Using a [employer].    Review of Systems  Constitutional: Negative.   HENT: Positive for congestion, ear pain, postnasal drip and sinus pressure.   Eyes: Negative.   Respiratory: Negative.        Objective:   Physical Exam  Constitutional: She appears well-developed and well-nourished.  HENT:  Right Ear: External ear normal.  Left Ear: External ear normal.  Nose: Nose normal.  Mouth/Throat: Oropharynx is clear and moist.  Eyes: Conjunctivae are normal.  Pulmonary/Chest: Effort normal and breath sounds normal.  Lymphadenopathy:    She has no cervical adenopathy.          Assessment & Plan:  Add Flonase sprays prn

## 2014-04-24 ENCOUNTER — Telehealth: Payer: Self-pay | Admitting: Family Medicine

## 2014-04-24 NOTE — Telephone Encounter (Signed)
Pt needs college cpx before  May 6. Can I create 30 min slot?

## 2014-04-25 NOTE — Telephone Encounter (Signed)
OK - but please do not use same day appointments or put in 15 minute slot. Thank you.

## 2014-04-25 NOTE — Telephone Encounter (Signed)
Pt has been sch

## 2014-05-08 ENCOUNTER — Ambulatory Visit (INDEPENDENT_AMBULATORY_CARE_PROVIDER_SITE_OTHER): Payer: 59 | Admitting: Family Medicine

## 2014-05-08 ENCOUNTER — Encounter: Payer: Self-pay | Admitting: Family Medicine

## 2014-05-08 VITALS — BP 108/68 | HR 79 | Temp 98.6°F | Ht 68.0 in | Wt 182.8 lb

## 2014-05-08 DIAGNOSIS — H6121 Impacted cerumen, right ear: Secondary | ICD-10-CM | POA: Diagnosis not present

## 2014-05-08 MED ORDER — CIPROFLOXACIN-DEXAMETHASONE 0.3-0.1 % OT SUSP: 2.0000 [drp] | Freq: Two times a day (BID) | OTIC | Status: DC

## 2014-05-08 NOTE — Patient Instructions (Addendum)
BEFORE YOU LEAVE: -ear lavage R ear  Cerumen Impaction A cerumen impaction is when the wax in your ear forms a plug. This plug usually causes reduced hearing. Sometimes it also causes an earache or dizziness. Removing a cerumen impaction can be difficult and painful. The wax sticks to the ear canal. The canal is sensitive and bleeds easily. If you try to remove a heavy wax buildup with a cotton tipped swab, you may push it in further. Irrigation with water, suction, and small ear curettes may be used to clear out the wax. If the impaction is fixed to the skin in the ear canal, ear drops may be needed for a few days to loosen the wax. People who build up a lot of wax frequently can use ear wax removal products available in your local drugstore. SEEK MEDICAL CARE IF:  You develop an earache, increased hearing loss, or marked dizziness. Document Released: 02/12/2004 Document Revised: 03/29/2011 Document Reviewed: 04/03/2009 [facility] Patient Information 2015 [place], [place]. This information is not intended to replace advice given to you by your health care provider. Make sure you discuss any questions you have with your health care provider.   We recommend the following healthy lifestyle measures: - eat a healthy diet consisting of lots of vegetables, fruits, beans, nuts, seeds, healthy meats such as white chicken and fish and whole grains.  - avoid fried foods, fast food, processed foods, sodas, red meet and other fattening foods.  - get a least 150 minutes of aerobic exercise per week.

## 2014-05-08 NOTE — Progress Notes (Signed)
  HPI:  R ear full: -felt like there was water in ear yesterday and did ear drops and qtip and now is worse -a little pain from time to time, feels full, reduce hearing out of this ear -denies: fevers, drainage, tinnitus, sinus congestion, cough, sneezing   ROS: See pertinent positives and negatives per HPI.  Past Medical History  Diagnosis Date  . SAB (spontaneous abortion) 2010    x 2 - both in 2010  . HSV infection     Past Surgical History  Procedure Laterality Date  . Cesarean section  2011 and 2013    x 2 both in [place], [place]  . Wisdom tooth extraction    . Cesarean section N/A 10/21/2013    Procedure: CESAREAN SECTION;  Surgeon: Allena Katz, MD;  Location: [facility];  Service: Obstetrics;  Laterality: N/A;    Family History  Problem Relation Age of Onset  . Diabetes Mother   . Multiple sclerosis Mother   . Diabetes Maternal Grandmother   . Hypertension Maternal Grandmother   . Cancer Maternal Aunt 59    History   Social History  . Marital Status: Married    Spouse Name: N/A  . Number of Children: N/A  . Years of Education: N/A   Social History Main Topics  . Smoking status: Never Smoker   . Smokeless tobacco: Never Used  . Alcohol Use: No  . Drug Use: No  . Sexual Activity: Yes    Birth Control/ Protection: None     Comment: pregnant   Other Topics Concern  . 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  CAMILA 0.35 MG tablet, Take 1 tablet by mouth daily., Disp: , Rfl: 11  EXAM:  Filed Vitals:   05/08/14 1042  BP: 108/68  Pulse: 79  Temp: 98.6 F (37 C)    Body mass index is 27.8 kg/(m^2).  GENERAL: vitals reviewed  and listed above, alert, oriented, appears well hydrated and in no acute distress  HEENT: atraumatic, conjunttiva clear, R ear canal with soft cerumen impaction, no other abnormal findings, no obvious abnormalities on inspection of external nose and ears  NECK: no obvious masses on inspection  MS: moves all extremities without noticeable abnormality  PSYCH: pleasant and cooperative, no obvious depression or anxiety  ASSESSMENT AND PLAN:  Discussed the following assessment and plan:  Cerumen impaction, right -discussed options for tx - she opted for ear lavage -discussed risks of this procedure and she opted to do -lavage performed by staff -Patient advised to return or notify a doctor immediately if symptoms worsen or persist or new concerns arise.  There are no Patient Instructions on file for this visit.   Colin Benton R.

## 2014-05-08 NOTE — Addendum Note (Signed)
Addended by: Agnes Lawrence on: 05/08/2014 11:34 AM   Modules accepted: Orders

## 2014-05-08 NOTE — Progress Notes (Signed)
Pre visit review using our clinic review tool, if applicable. No additional management support is needed unless otherwise documented below in the visit note. 

## 2014-05-15 ENCOUNTER — Ambulatory Visit (INDEPENDENT_AMBULATORY_CARE_PROVIDER_SITE_OTHER): Payer: 59 | Admitting: Family Medicine

## 2014-05-15 ENCOUNTER — Encounter: Payer: Self-pay | Admitting: Family Medicine

## 2014-05-15 VITALS — BP 110/76 | HR 75 | Temp 98.6°F | Ht 68.0 in | Wt 181.9 lb

## 2014-05-15 DIAGNOSIS — Z0000 Encounter for general adult medical examination without abnormal findings: Secondary | ICD-10-CM | POA: Diagnosis not present

## 2014-05-15 NOTE — Patient Instructions (Signed)
Please bring childhood vaccines, recent flu vaccine confirmation and most recent TB skin test results so that we can attach to your form.  We recommend the following healthy lifestyle measures: - eat a healthy diet consisting of lots of vegetables, fruits, beans, nuts, seeds, healthy meats such as white chicken and fish and whole grains.  - avoid fried foods, fast food, processed foods, sodas, red meet and other fattening foods.  - get a least 150 minutes of aerobic exercise per week.

## 2014-05-15 NOTE — Progress Notes (Signed)
Pre visit review using our clinic review tool, if applicable. No additional management support is needed unless otherwise documented below in the visit note. 

## 2014-05-15 NOTE — Progress Notes (Signed)
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  -Diet: variety of foods, balance and well rounded  -Exercise: no regular exercise - signed up for 5k  -Taking folic acid, vitamin D or calcium: no  -Diabetes and Dyslipidemia Screening:   -Hx of HTN: no  -Vaccines: UTD  -pap history: with gyn in 12/2013  -FDLMP: May 11, 2014  -sexual activity: yes, female partner, no new partners  -wants STI testing: no  -FH breast, colon or ovarian ca: see FH Last mammogram:n/a Last colon cancer screening:n/a  -Alcohol, Tobacco, drug use: see social history  Review of Systems - no fevers, unintentional weight loss, vision loss, hearing loss, chest pain, sob, hemoptysis, melena, hematochezia, hematuria, genital discharge, changing or concerning skin lesions, bleeding, bruising, loc, thoughts of self harm or SI  Past Medical History  Diagnosis Date  . SAB (spontaneous abortion) 2010    x 2 - both in 2010  . HSV infection     Past Surgical History  Procedure Laterality Date  . Cesarean section  2011 and 2013    x 2 both in [place], [place]  . Wisdom tooth extraction    . Cesarean section N/A 10/21/2013    Procedure: CESAREAN SECTION;  Surgeon: Allena Katz, MD;  Location: [facility];  Service: Obstetrics;  Laterality: N/A;    Family History  Problem Relation Age of Onset  . Diabetes Mother   . Multiple sclerosis Mother   . Diabetes Maternal Grandmother   . Hypertension Maternal Grandmother   . Cancer Maternal Aunt 34    History   Social History  . Marital Status: Married    Spouse Name: N/A  . Number of Children: N/A  . Years of Education: N/A   Social History Main Topics  . Smoking status: Never Smoker   . Smokeless tobacco: Never Used  . Alcohol Use: No  . Drug Use: No  . Sexual Activity: Yes    Birth Control/ Protection: None     Comment: pregnant   Other Topics Concern  . 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  CAMILA 0.35 MG tablet, Take 1 tablet by mouth daily., Disp: , Rfl: 11  EXAM:  Filed Vitals:   05/15/14 1024  BP: 110/76  Pulse: 75  Temp: 98.6 F (37 C)    GENERAL: vitals reviewed and listed below, alert, oriented, appears well hydrated and in no acute distress  HEENT: head atraumatic, PERRLA, normal appearance of eyes, ears, nose and mouth. moist mucus membranes.  NECK: supple, no masses or lymphadenopathy  LUNGS: clear to auscultation bilaterally, no rales, rhonchi or wheeze  CV: HRRR, no peripheral edema or cyanosis, normal pedal pulses  BREAST: declined - does with gyn  ABDOMEN: bowel sounds normal, soft, non tender to palpation, no masses, no rebound or guarding  GU: declined dose with gyn  RECTAL: refused  SKIN: no rash or abnormal lesions  MS: normal gait, moves all extremities normally  NEURO: CN II-XII grossly intact, normal muscle strength and sensation to light touch on extremities  PSYCH: normal affect, pleasant and cooperative  ASSESSMENT AND PLAN:  Discussed the following assessment and plan:  Encounter for preventive health examination   -Discussed and advised all [place] preventive services health task force level A and B recommendations for age, sex and risks.  -Advised at least 150 minutes of exercise per week and a healthy diet low in saturated fats and sweets and consisting of fresh fruits and vegetables, lean meats such as fish and white chicken and whole grains.  -labs, studies and vaccines per orders this encounter  -she will bring a few needed vaccine records to attach to form - my assistant has  this form to return once these are received to attach.  No orders of the defined types were placed in this encounter.    Patient advised to return to clinic immediately if symptoms worsen or persist or new concerns.  Patient Instructions  Please bring childhood vaccines, recent flu vaccine confirmation and most recent TB skin test results so that we can attach to your form.  We recommend the following healthy lifestyle measures: - eat a healthy diet consisting of lots of vegetables, fruits, beans, nuts, seeds, healthy meats such as white chicken and fish and whole grains.  - avoid fried foods, fast food, processed foods, sodas, red meet and other fattening foods.  - get a least 150 minutes of aerobic exercise per week.      No Follow-up on file.  Colin Benton R.

## 2014-06-27 ENCOUNTER — Encounter (HOSPITAL_COMMUNITY): Payer: Self-pay | Admitting: Obstetrics & Gynecology

## 2015-01-21 ENCOUNTER — Ambulatory Visit (INDEPENDENT_AMBULATORY_CARE_PROVIDER_SITE_OTHER): Payer: 59 | Admitting: Family Medicine

## 2015-01-21 ENCOUNTER — Encounter: Payer: Self-pay | Admitting: Family Medicine

## 2015-01-21 VITALS — BP 108/80 | HR 88 | Temp 98.8°F | Ht 68.0 in | Wt 176.9 lb

## 2015-01-21 DIAGNOSIS — J069 Acute upper respiratory infection, unspecified: Secondary | ICD-10-CM

## 2015-01-21 MED ORDER — BENZONATATE 100 MG PO CAPS: 100.0000 mg | ORAL_CAPSULE | Freq: Three times a day (TID) | ORAL | Status: DC | PRN

## 2015-01-21 MED ORDER — DOXYCYCLINE HYCLATE 100 MG PO TABS: 100.0000 mg | ORAL_TABLET | Freq: Two times a day (BID) | ORAL | Status: DC

## 2015-01-21 NOTE — Patient Instructions (Signed)
Please take the medications as instructed.  Follow up if any worsening or persistent symptoms or other concerns.  I hope you feel better soon!

## 2015-01-21 NOTE — Progress Notes (Signed)
HPI:  URI: -started: 2 weeks ago -symptoms:nasal congestion, sore throat, cough, some sinus pain -denies:fever, SOB, NVD, tooth pain -sick contacts/travel/risks: denies flu exposure, tick exposure or or Ebola risks -FDLMP: Jan1 2016  ROS: See pertinent positives and negatives per HPI.  Past Medical History  Diagnosis Date  . SAB (spontaneous abortion) 2010    x 2 - both in 2010  . HSV infection     Past Surgical History  Procedure Laterality Date  . Cesarean section  2011 and 2013    x 2 both in [place], [place]  . Wisdom tooth extraction    . Cesarean section N/A 10/21/2013    Procedure: CESAREAN SECTION;  Surgeon: Allena Katz, MD;  Location: [facility];  Service: Obstetrics;  Laterality: N/A;    Family History  Problem Relation Age of Onset  . Diabetes Mother   . Multiple sclerosis Mother   . Diabetes Maternal Grandmother   . Hypertension Maternal Grandmother   . Cancer Maternal Aunt 43    Social History   Social History  . Marital Status: Married    Spouse Name: N/A  . Number of Children: N/A  . Years of Education: N/A   Social History Main Topics  . Smoking status: Never Smoker   . Smokeless tobacco: Never Used  . Alcohol Use: No  . Drug Use: No  . Sexual Activity: Yes    Birth Control/ Protection: None     Comment: pregnant   Other Topics Concern  . 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  Norgestimate-Eth Estradiol (SPRINTEC 28 PO), Take by mouth., Disp: , Rfl:  .  benzonatate (TESSALON PERLES) 100 MG capsule, Take 1 capsule (100 mg total) by mouth 3 (three) times daily as needed., Disp: 20 capsule, Rfl: 0 .  doxycycline  (VIBRA-TABS) 100 MG tablet, Take 1 tablet (100 mg total) by mouth 2 (two) times daily., Disp: 20 tablet, Rfl: 0  EXAM:  Filed Vitals:   01/21/15 1617  BP: 108/80  Pulse: 88  Temp: 98.8 F (37.1 C)  O2 not obtained due to thick nail polish  Body mass index is 26.9 kg/(m^2).  GENERAL: vitals reviewed and listed above, alert, oriented, appears well hydrated and in no acute distress  HEENT: atraumatic, conjunttiva clear, no obvious abnormalities on inspection of external nose and ears, normal appearance of ear canals and TMs, thick white nasal congestion, mild post oropharyngeal erythema with PND, no tonsillar edema or exudate, no sinus TTP  NECK: no obvious masses on inspection  LUNGS: clear to auscultation bilaterally, no wheezes, rales or rhonchi, good air movement  CV: HRRR, no peripheral edema  MS: moves all extremities without noticeable abnormality  PSYCH: pleasant and cooperative, no obvious depression or anxiety  ASSESSMENT AND PLAN:  Discussed the following assessment and plan:  Acute upper respiratory infection  -We discussed potential etiologies, with sinusitis being most likely. We discussed treatment side effects, likely course, antibiotic misuse, transmission, and signs of developing a serious illness. -of course, we advised to  return or notify a doctor immediately if symptoms worsen or persist or new concerns arise.    Patient Instructions  Please take the medications as instructed.  Follow up if any worsening or persistent symptoms or other concerns.  I hope you feel better soon!     Colin Benton R.

## 2015-01-21 NOTE — Progress Notes (Signed)
Pre visit review using our clinic review tool, if applicable. No additional management support is needed unless otherwise documented below in the visit note. 

## 2015-03-07 ENCOUNTER — Ambulatory Visit (INDEPENDENT_AMBULATORY_CARE_PROVIDER_SITE_OTHER): Payer: 59 | Admitting: Family Medicine

## 2015-03-07 VITALS — BP 92/72 | HR 106 | Temp 98.7°F | Ht 68.0 in | Wt 174.8 lb

## 2015-03-07 DIAGNOSIS — S8011XA Contusion of right lower leg, initial encounter: Secondary | ICD-10-CM | POA: Diagnosis not present

## 2015-03-07 NOTE — Progress Notes (Signed)
   Subjective:    Patient ID: Sarah Sherman, female    DOB: 1980-08-25, 35 y.o.   MRN: [id number]  HPI Patient seen to evaluate a "knot "right posterior leg. Around Thanksgiving she was standing beside her minivan and the door started to close and bumped into her right posterior calf region. She described what sounds like a hematoma with some obvious bruising and local soreness and localized swelling. Overall, this is improving and less swollen and less painful but she was concerned because she still has some very mild right leg edema dependent to this area-worse late in the day after standing She still notices a little bit of firmness to palpation but no pain with ambulation No medial calf pain or thigh pain. No chest pains or dyspnea. Patient has no history of DVT. Nonsmoker. Does take birth control.  Past Medical History  Diagnosis Date  . SAB (spontaneous abortion) 2010    x 2 - both in 2010  . HSV infection    Past Surgical History  Procedure Laterality Date  . Cesarean section  2011 and 2013    x 2 both in [place], [place]  . Wisdom tooth extraction    . Cesarean section N/A 10/21/2013    Procedure: CESAREAN SECTION;  Surgeon: Allena Katz, MD;  Location: [facility];  Service: Obstetrics;  Laterality: N/A;    reports that she has never smoked. She has never used smokeless tobacco. She reports that she does not drink alcohol or use illicit drugs. family history includes Cancer (age of onset: 20) in her maternal aunt; Diabetes in her maternal grandmother and mother; Hypertension in her maternal grandmother; Multiple sclerosis in her mother. No Known Allergies    Review of Systems  Constitutional: Negative for fever and chills.  Respiratory: Negative for shortness of breath.   Cardiovascular: Negative for chest pain.  Musculoskeletal: Negative for gait problem.  Neurological: Negative for weakness and numbness.       Objective:   Physical Exam  Constitutional: She  appears well-developed and well-nourished.  Cardiovascular: Normal rate and regular rhythm.   Pulmonary/Chest: Effort normal and breath sounds normal. No respiratory distress. She has no wheezes. She has no rales.  Musculoskeletal:  Right Achilles is fully intact. She has full range of motion with dorsiflexion and plantar flexion with no pain. She has minimal hematoma/induration in a region about 1 cm diameter palpated along the distal one third of the midline of her calf region. No medial calf tenderness. No Achilles tendon tenderness Distal foot pulses are normal          Assessment & Plan:  Resolving hematoma right leg. She does not have any calf tenderness to suggest DVT. She was concerned because she still has some very mild edema. Overall though, this appears to be resolving. We've suggested some heat intermittently and suspect this will continue to resolve.

## 2015-03-07 NOTE — Progress Notes (Signed)
Pre visit review using our clinic review tool, if applicable. No additional management support is needed unless otherwise documented below in the visit note. 

## 2015-03-07 NOTE — Patient Instructions (Signed)
Hematoma  A hematoma is a collection of blood under the skin, in an organ, in a body space, in a joint space, or in other tissue. The blood can clot to form a lump that you can see and feel. The lump is often firm and may sometimes become sore and tender. Most hematomas get better in a few days to weeks. However, some hematomas may be serious and require medical care. Hematomas can range in size from very small to very large.  CAUSES   A hematoma can be caused by a blunt or penetrating injury. It can also be caused by spontaneous leakage from a blood vessel under the skin. Spontaneous leakage from a blood vessel is more likely to occur in older people, especially those taking blood thinners. Sometimes, a hematoma can develop after certain medical procedures.  SIGNS AND SYMPTOMS   · A firm lump on the body.  · Possible pain and tenderness in the area.  · Bruising. Blue, dark blue, purple-red, or yellowish skin may appear at the site of the hematoma if the hematoma is close to the surface of the skin.  For hematomas in deeper tissues or body spaces, the signs and symptoms may be subtle. For example, an intra-abdominal hematoma may cause abdominal pain, weakness, fainting, and shortness of breath. An intracranial hematoma may cause a headache or symptoms such as weakness, trouble speaking, or a change in consciousness.  DIAGNOSIS   A hematoma can usually be diagnosed based on your medical history and a physical exam. Imaging tests may be needed if your health care provider suspects a hematoma in deeper tissues or body spaces, such as the abdomen, head, or chest. These tests may include ultrasonography or a CT scan.   TREATMENT   Hematomas usually go away on their own over time. Rarely does the blood need to be drained out of the body. Large hematomas or those that may affect vital organs will sometimes need surgical drainage or monitoring.  HOME CARE INSTRUCTIONS   · Apply ice to the injured area:      Put ice in a  plastic bag.      Place a towel between your skin and the bag.      Leave the ice on for 20 minutes, 2-3 times a day for the first 1 to 2 days.    · After the first 2 days, switch to using warm compresses on the hematoma.    · Elevate the injured area to help decrease pain and swelling. Wrapping the area with an elastic bandage may also be helpful. Compression helps to reduce swelling and promotes shrinking of the hematoma. Make sure the bandage is not wrapped too tight.    · If your hematoma is on a lower extremity and is painful, crutches may be helpful for a couple days.    · Only take over-the-counter or prescription medicines as directed by your health care provider.  SEEK IMMEDIATE MEDICAL CARE IF:   · You have increasing pain, or your pain is not controlled with medicine.    · You have a fever.    · You have worsening swelling or discoloration.    · Your skin over the hematoma breaks or starts bleeding.    · Your hematoma is in your chest or abdomen and you have weakness, shortness of breath, or a change in consciousness.  · Your hematoma is on your scalp (caused by a fall or injury) and you have a worsening headache or a change in alertness or consciousness.  MAKE SURE YOU:   ·   Understand these instructions.  · Will watch your condition.  · Will get help right away if you are not doing well or get worse.     This information is not intended to replace advice given to you by your health care provider. Make sure you discuss any questions you have with your health care provider.     Document Released: 08/19/2003 Document Revised: 09/06/2012 Document Reviewed: 06/14/2012  Elsevier Interactive Patient Education ©2016 [employer]

## 2015-05-27 ENCOUNTER — Telehealth: Payer: Self-pay

## 2015-05-27 ENCOUNTER — Other Ambulatory Visit: Payer: Self-pay | Admitting: *Deleted

## 2015-05-27 ENCOUNTER — Ambulatory Visit (INDEPENDENT_AMBULATORY_CARE_PROVIDER_SITE_OTHER): Payer: 59 | Admitting: Family Medicine

## 2015-05-27 ENCOUNTER — Encounter: Payer: Self-pay | Admitting: Family Medicine

## 2015-05-27 DIAGNOSIS — Z0000 Encounter for general adult medical examination without abnormal findings: Secondary | ICD-10-CM | POA: Diagnosis not present

## 2015-05-27 LAB — LIPID PANEL
Cholesterol: 122 mg/dL (ref 0–200)
HDL: 52.1 mg/dL (ref >39.00)
LDL CALC: 57 mg/dL (ref 0–99)
NonHDL: 70.08
TRIGLYCERIDES: 66 mg/dL (ref 0.0–149.0)
Total CHOL/HDL Ratio: 2
VLDL: 13.2 mg/dL (ref 0.0–40.0)

## 2015-05-27 LAB — BASIC METABOLIC PANEL
BUN: 12 mg/dL (ref 6–23)
CHLORIDE: 105 meq/L (ref 96–112)
CO2: 26 mEq/L (ref 19–32)
CREATININE: 0.7 mg/dL (ref 0.40–1.20)
Calcium: 9 mg/dL (ref 8.4–10.5)
GFR: 123.25 mL/min (ref >60.00)
Glucose, Bld: 85 mg/dL (ref 70–99)
POTASSIUM: 3.8 meq/L (ref 3.5–5.1)
Sodium: 138 mEq/L (ref 135–145)

## 2015-05-27 LAB — CBC WITH DIFFERENTIAL/PLATELET
BASOS ABS: 0 10*3/uL (ref 0.0–0.1)
Basophils Relative: 0.5 % (ref 0.0–3.0)
EOS ABS: 0.1 10*3/uL (ref 0.0–0.7)
Eosinophils Relative: 2.2 % (ref 0.0–5.0)
HCT: 34.5 % — ABNORMAL LOW (ref 36.0–46.0)
Hemoglobin: 11.2 g/dL — ABNORMAL LOW (ref 12.0–15.0)
LYMPHS ABS: 2.3 10*3/uL (ref 0.7–4.0)
Lymphocytes Relative: 42.1 % (ref 12.0–46.0)
MCHC: 32.4 g/dL (ref 30.0–36.0)
MCV: 91.2 fl (ref 78.0–100.0)
Monocytes Absolute: 0.4 10*3/uL (ref 0.1–1.0)
Monocytes Relative: 6.8 % (ref 3.0–12.0)
NEUTROS ABS: 2.7 10*3/uL (ref 1.4–7.7)
NEUTROS PCT: 48.4 % (ref 43.0–77.0)
Platelets: 154 10*3/uL (ref 150.0–400.0)
RBC: 3.78 Mil/uL — AB (ref 3.87–5.11)
RDW: 12.8 % (ref 11.5–15.5)
WBC: 5.5 10*3/uL (ref 4.0–10.5)

## 2015-05-27 NOTE — Telephone Encounter (Signed)
Left message for patient to call office to schedule appointment.  Informed patient of lab results.

## 2015-05-27 NOTE — Telephone Encounter (Signed)
-----   Message from Lucretia Kern, DO sent at 05/27/2015 12:37 PM EDT ----- Please let her know that her labs are okay, except for mild anemia. We can discuss the anemia at her appointment. Please help her reschedule her physical or at least schedule a follow-up visit in the next 1 month to discuss the anemia and do further testing if needed.

## 2015-05-27 NOTE — Progress Notes (Signed)
Patient came late and did not want to wait to be worked in. LATE CANCEL for CPE.

## 2015-05-28 ENCOUNTER — Encounter: Payer: 59 | Admitting: Family Medicine

## 2015-06-02 ENCOUNTER — Ambulatory Visit (INDEPENDENT_AMBULATORY_CARE_PROVIDER_SITE_OTHER): Payer: 59 | Admitting: Family Medicine

## 2015-06-02 ENCOUNTER — Encounter: Payer: Self-pay | Admitting: Family Medicine

## 2015-06-02 VITALS — BP 116/82 | HR 78 | Temp 98.8°F | Ht 68.0 in | Wt 178.0 lb

## 2015-06-02 DIAGNOSIS — T148 Other injury of unspecified body region: Secondary | ICD-10-CM | POA: Diagnosis not present

## 2015-06-02 DIAGNOSIS — D500 Iron deficiency anemia secondary to blood loss (chronic): Secondary | ICD-10-CM | POA: Diagnosis not present

## 2015-06-02 DIAGNOSIS — N920 Excessive and frequent menstruation with regular cycle: Secondary | ICD-10-CM | POA: Diagnosis not present

## 2015-06-02 DIAGNOSIS — T148XXA Other injury of unspecified body region, initial encounter: Secondary | ICD-10-CM

## 2015-06-02 DIAGNOSIS — Z0000 Encounter for general adult medical examination without abnormal findings: Secondary | ICD-10-CM

## 2015-06-02 NOTE — Progress Notes (Signed)
Pre visit review using our clinic review tool, if applicable. No additional management support is needed unless otherwise documented below in the visit note. 

## 2015-06-02 NOTE — Patient Instructions (Signed)
Follow up yearly and as needed  Vitamin D3 [phone] international units daily  Iron supplement once daily or as tolerated  We recommend the following healthy lifestyle measures: - eat a healthy whole foods diet consisting of regular small meals composed of vegetables, fruits, beans, nuts, seeds, healthy meats such as white chicken and fish and whole grains.  - avoid sweets, white starchy foods, fried foods, fast food, processed foods, sodas, red meet and other fattening foods.  - get a least 150-300 minutes of aerobic exercise per week.

## 2015-06-02 NOTE — Progress Notes (Signed)
HPI:  Here for CPE:  -Concerns and/or follow up today:  Anemia: -on recent physical labs; mild, improved from in the past -Still has pretty heavy periods, regular and monthly, fills pad per hour during the first several days -On OCP, considering Mirena, not taking iron -denies: Palpitations, weakness, change in bowels, melena, hematochezia  Hematoma: -R posterior lower leg after getting hit by car door -seen by colleague for this -resolving, no pain  -Diet: variety of foods, balance and well rounded, larger portion sizes  -Exercise: Started back to regular exercise recently  -Taking folic acid, vitamin D or calcium: no  -Diabetes and Dyslipidemia Screening: Checked recently  -Vaccines: UTD  -pap history: Sees GYN, last Pap 12 2015; her patient she had physical in 11/2014 [facility]  -FDLMP: monthly, regular, heavy bleeding the first few days  -sexual activity: yes, female partner, no new partners  -wants STI testing (Hep C if born [birth year]): no  -FH breast, colon or ovarian ca: see FH Last mammogram:n/a Last colon cancer screening:n/a  Breast Ca Risk Assessment: -Does women's health with GYN   -Alcohol, Tobacco, drug use: see social history  Review of Systems - no fevers, unintentional weight loss, vision loss, hearing loss, chest pain, sob, hemoptysis, melena, hematochezia, hematuria, genital discharge, changing or concerning skin lesions, bleeding, bruising, loc, thoughts of self harm or SI  Past Medical History  Diagnosis Date  . SAB (spontaneous abortion) 2010    x 2 - both in 2010  . HSV infection     Past Surgical History  Procedure Laterality Date  . Cesarean section  2011 and 2013    x 2 both in [place], [place]  . Wisdom tooth extraction    . Cesarean section N/A 10/21/2013    Procedure: CESAREAN SECTION;  Surgeon: Allena Katz, MD;  Location: [facility];  Service: Obstetrics;  Laterality: N/A;    Family History   Problem Relation Age of Onset  . Diabetes Mother   . Multiple sclerosis Mother   . Diabetes Maternal Grandmother   . Hypertension Maternal Grandmother   . Cancer Maternal Aunt 13    Social History   Social History  . Marital Status: Married    Spouse Name: N/A  . Number of Children: N/A  . Years of Education: N/A   Social History Main Topics  . Smoking status: Never Smoker   . Smokeless tobacco: Never Used  . Alcohol Use: No  . Drug Use: No  . Sexual Activity: Yes    Birth Control/ Protection: None     Comment: pregnant   Other Topics Concern  . 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  Norgestimate-Eth Estradiol (SPRINTEC 28 PO), Take by mouth., Disp: , Rfl:   EXAM:  Filed Vitals:   06/02/15 1309  BP: 116/82  Pulse: 78  Temp: 98.8 F (37.1 C)    GENERAL: vitals reviewed and listed below, alert, oriented, appears well hydrated and in no acute distress  HEENT: head atraumatic, PERRLA, normal appearance of eyes, ears,  nose and mouth. moist mucus membranes.  NECK: supple, no masses or lymphadenopathy  LUNGS: clear to auscultation bilaterally, no rales, rhonchi or wheeze  CV: HRRR, no peripheral edema or cyanosis, normal pedal pulses  BREAST: sees gyn  ABDOMEN: bowel sounds normal, soft, non tender to palpation, no masses, no rebound or guarding  GU: sees gyn  SKIN: no rash or abnormal lesions, small subcut nodule with mild overlying hyperpig R lower post leg  MS: normal gait, moves all extremities normally  NEURO: CN II-XII grossly intact, normal muscle strength and sensation to light touch on extremities  PSYCH: normal affect, pleasant and  cooperative  ASSESSMENT AND PLAN:  Discussed the following assessment and plan:  Visit for preventive health examination -Discussed and advised all [place] preventive services health task force level A and B recommendations for age, sex and risks. -Advised at least 150 minutes of exercise per week and a healthy diet low in saturated fats and sweets and consisting of fresh fruits and vegetables, lean meats such as fish and white chicken and whole grains. -labs, studies and vaccines per orders this encounter  Iron deficiency anemia due to chronic blood loss Menorrhagia with regular cycle -Discussed treatment options, she sees GYN -She will try to take some iron or eat iron rich diet  Hematoma -Resolving, offered ultrasound, she opted to observe    No orders of the defined types were placed in this encounter.    Patient advised to return to clinic immediately if symptoms worsen or persist or new concerns.  Patient Instructions  Follow up yearly and as needed  Vitamin D3 [phone] international units daily  Iron supplement once daily or as tolerated  We recommend the following healthy lifestyle measures: - eat a healthy whole foods diet consisting of regular small meals composed of vegetables, fruits, beans, nuts, seeds, healthy meats such as white chicken and fish and whole grains.  - avoid sweets, white starchy foods, fried foods, fast food, processed foods, sodas, red meet and other fattening foods.  - get a least 150-300 minutes of aerobic exercise per week.      No Follow-up on file.  Colin Benton R.

## 2015-06-05 ENCOUNTER — Encounter: Payer: 59 | Admitting: Family Medicine

## 2015-06-25 ENCOUNTER — Telehealth: Payer: Self-pay | Admitting: Family Medicine

## 2015-06-25 NOTE — Telephone Encounter (Signed)
Pt would like her blood work to be release to [facility]

## 2015-06-26 NOTE — Telephone Encounter (Signed)
Patient called back and I informed her there is a restriction on her acct and she was given the Mychart phone number of [phone] to contact someone for information.

## 2015-06-26 NOTE — Telephone Encounter (Signed)
I left a message for the pt to return my call. 

## 2015-07-21 ENCOUNTER — Telehealth: Payer: 59 | Admitting: Family

## 2015-07-21 DIAGNOSIS — B9689 Other specified bacterial agents as the cause of diseases classified elsewhere: Secondary | ICD-10-CM

## 2015-07-21 DIAGNOSIS — A499 Bacterial infection, unspecified: Secondary | ICD-10-CM

## 2015-07-21 DIAGNOSIS — J329 Chronic sinusitis, unspecified: Secondary | ICD-10-CM

## 2015-07-21 MED ORDER — AMOXICILLIN-POT CLAVULANATE 875-125 MG PO TABS: 1.0000 | ORAL_TABLET | Freq: Two times a day (BID) | ORAL | Status: AC

## 2015-07-21 NOTE — Progress Notes (Signed)

## 2015-07-28 ENCOUNTER — Telehealth: Payer: 59 | Admitting: Family

## 2015-07-28 DIAGNOSIS — B3731 Acute candidiasis of vulva and vagina: Secondary | ICD-10-CM

## 2015-07-28 DIAGNOSIS — B373 Candidiasis of vulva and vagina: Secondary | ICD-10-CM

## 2015-07-28 MED ORDER — FLUCONAZOLE 150 MG PO TABS: 150.0000 mg | ORAL_TABLET | Freq: Once | ORAL | Status: DC

## 2015-07-28 NOTE — Progress Notes (Signed)

## 2015-09-09 ENCOUNTER — Encounter: Payer: Self-pay | Admitting: Family Medicine

## 2015-12-22 DIAGNOSIS — Z6825 Body mass index (BMI) 25.0-25.9, adult: Secondary | ICD-10-CM | POA: Diagnosis not present

## 2015-12-22 DIAGNOSIS — D649 Anemia, unspecified: Secondary | ICD-10-CM | POA: Diagnosis not present

## 2015-12-22 DIAGNOSIS — Z01411 Encounter for gynecological examination (general) (routine) with abnormal findings: Secondary | ICD-10-CM | POA: Diagnosis not present

## 2015-12-22 DIAGNOSIS — Z01419 Encounter for gynecological examination (general) (routine) without abnormal findings: Secondary | ICD-10-CM | POA: Diagnosis not present

## 2016-01-21 ENCOUNTER — Ambulatory Visit (INDEPENDENT_AMBULATORY_CARE_PROVIDER_SITE_OTHER): Payer: 59 | Admitting: Family Medicine

## 2016-01-21 ENCOUNTER — Encounter: Payer: Self-pay | Admitting: Family Medicine

## 2016-01-21 VITALS — BP 122/70 | HR 112 | Temp 98.7°F | Wt 164.9 lb

## 2016-01-21 DIAGNOSIS — J111 Influenza due to unidentified influenza virus with other respiratory manifestations: Secondary | ICD-10-CM | POA: Diagnosis not present

## 2016-01-21 DIAGNOSIS — R509 Fever, unspecified: Secondary | ICD-10-CM

## 2016-01-21 LAB — POCT INFLUENZA A/B: Influenza A, POC: POSITIVE — AB

## 2016-01-21 NOTE — Patient Instructions (Signed)

## 2016-01-21 NOTE — Progress Notes (Signed)
Subjective:     Patient ID: Sarah Sherman, female   DOB: 1980/04/24, 36 y.o.   MRN: [id number]  HPI Patient here for acute illness. Onset about 3 days ago of fever, chills, headaches, mild body aches. Her temperature is down today. Denies any nausea, vomiting, or diarrhea. Feels slightly better today. She has 3 kids at home and was concerned whether she may have influenza.  Past Medical History:  Diagnosis Date  . HSV infection   . SAB (spontaneous abortion) 2010   x 2 - both in 2010   Past Surgical History:  Procedure Laterality Date  . CESAREAN SECTION  2011 and 2013   x 2 both in [place], [place]  . CESAREAN SECTION N/A 10/21/2013   Procedure: CESAREAN SECTION;  Surgeon: Allena Katz, MD;  Location: [facility];  Service: Obstetrics;  Laterality: N/A;  . WISDOM TOOTH EXTRACTION      reports that she has never smoked. She has never used smokeless tobacco. She reports that she does not drink alcohol or use drugs. family history includes Cancer (age of onset: 45) in her maternal aunt; Diabetes in her maternal grandmother and mother; Hypertension in her maternal grandmother; Multiple sclerosis in her mother. No Known Allergies   Review of Systems  Constitutional: Positive for fatigue.  HENT: Positive for congestion.   Respiratory: Positive for cough.   Neurological: Positive for headaches.       Objective:   Physical Exam  Constitutional: She appears well-developed and well-nourished.  HENT:  Right Ear: External ear normal.  Left Ear: External ear normal.  Mouth/Throat: Oropharynx is clear and moist.  Neck: Neck supple.  Cardiovascular: Normal rate and regular rhythm.   Pulmonary/Chest: Effort normal and breath sounds normal. No respiratory distress. She has no wheezes. She has no rales.  Lymphadenopathy:    She has no cervical adenopathy.       Assessment:     Acute febrile illness. Suspect viral. Patient is nontoxic in appearance and nonfocal exam Influenza  screen positive.     Plan:     -Check influenza screen-her patient request. Would probably not change management since she is over 48 hours into illness -Stay well-hydrated -Follow-up for any recurrent fever or other concerns -work note to be our for another couple of days.   Eulas Post MD [facility]

## 2016-01-21 NOTE — Progress Notes (Signed)
Pre visit review using our clinic review tool, if applicable. No additional management support is needed unless otherwise documented below in the visit note. 

## 2016-03-22 DIAGNOSIS — J3089 Other allergic rhinitis: Secondary | ICD-10-CM | POA: Diagnosis not present

## 2016-03-22 DIAGNOSIS — R0602 Shortness of breath: Secondary | ICD-10-CM | POA: Diagnosis not present

## 2016-03-22 DIAGNOSIS — H1045 Other chronic allergic conjunctivitis: Secondary | ICD-10-CM | POA: Diagnosis not present

## 2016-05-07 ENCOUNTER — Telehealth: Payer: 59 | Admitting: Nurse Practitioner

## 2016-05-07 DIAGNOSIS — B9689 Other specified bacterial agents as the cause of diseases classified elsewhere: Secondary | ICD-10-CM | POA: Diagnosis not present

## 2016-05-07 DIAGNOSIS — N760 Acute vaginitis: Secondary | ICD-10-CM | POA: Diagnosis not present

## 2016-05-07 MED ORDER — METRONIDAZOLE 500 MG PO TABS: 500.0000 mg | ORAL_TABLET | Freq: Three times a day (TID) | ORAL | 0 refills | Status: DC

## 2016-05-07 NOTE — Progress Notes (Signed)

## 2016-08-07 DIAGNOSIS — J020 Streptococcal pharyngitis: Secondary | ICD-10-CM | POA: Diagnosis not present

## 2016-09-29 NOTE — Progress Notes (Signed)
HPI:  Here for CPE:  -Concerns and/or follow up today:  PMH heavy menstrual bleeding and anemia. Sees gyn at [facility]. Due for labs and flu shot. Wants to get flu shot at work - refused here today. Reports will be seeing gyn for pelvic/pap/breast exam. FDLMP 09/14/16  -Diet: variety of foods, balance and well rounded  -Exercise: regular exercise  -Taking folic acid, vitamin D or calcium: no  -Diabetes and Dyslipidemia Screening: fasting for labs  -Hx of HTN: no  -Vaccines: UTD except flu - declined  -pap history: does with gyn, reports will schedule with gyn  -wants STI testing (Hep C if born 45-65): no - declined  -FH breast, colon or ovarian ca: see FH Last mammogram: n/a Last colon cancer screening: n/a  -Alcohol, Tobacco, drug use: see social history  Review of Systems - no fevers, unintentional weight loss, vision loss, hearing loss, chest pain, sob, hemoptysis, melena, hematochezia, hematuria, genital discharge, changing or concerning skin lesions, bleeding, bruising, loc, thoughts of self harm or SI  Past Medical History:  Diagnosis Date  . HSV infection   . SAB (spontaneous abortion) 2010   x 2 - both in 2010    Past Surgical History:  Procedure Laterality Date  . CESAREAN SECTION  2011 and 2013   x 2 both in [place], [place]  . CESAREAN SECTION N/A 10/21/2013   Procedure: CESAREAN SECTION;  Surgeon: Allena Katz, MD;  Location: [facility];  Service: Obstetrics;  Laterality: N/A;  . WISDOM TOOTH EXTRACTION      Family History  Problem Relation Age of Onset  . Diabetes Mother   . Multiple sclerosis Mother   . Diabetes Maternal Grandmother   . Hypertension Maternal Grandmother   . Cancer Maternal Aunt 1    Social History   Social History  . Marital status: Married    Spouse name: N/A  . Number of children: N/A  . Years of education: N/A   Social History Main Topics  . Smoking status: Never Smoker  . Smokeless tobacco: Never  Used  . Alcohol use No  . Drug use: No  . Sexual activity: Yes    Birth control/ protection: None     Comment: pregnant   Other Topics Concern  . 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  metroNIDAZOLE (FLAGYL) 500 MG tablet, Take 1 tablet (500 mg total) by mouth 3 (three) times daily., Disp: 21 tablet, Rfl: 0 .  Norgestimate-Eth Estradiol (SPRINTEC 28 PO), Take by mouth., Disp: , Rfl:  .  SPRINTEC 28 0.25-35 MG-MCG tablet, See admin instructions., Disp: , Rfl: 3  EXAM:  Vitals:   09/30/16 0717  BP: 100/68  Pulse: 67  Temp: 98.5 F (36.9 C)    GENERAL: vitals reviewed and listed below, alert, oriented, appears well hydrated and in no acute distress  HEENT: head atraumatic, PERRLA, normal appearance of eyes, ears, nose and mouth. moist mucus membranes.  NECK: supple, no masses or lymphadenopathy  LUNGS: clear to auscultation bilaterally, no rales, rhonchi or wheeze  CV:  HRRR, no peripheral edema or cyanosis, normal pedal pulses  ABDOMEN: bowel sounds normal, soft, non tender to palpation, no masses, no rebound or guarding  SKIN: no rash or abnormal lesions  GU/BREAST: declined  MS: normal gait, moves all extremities normally  NEURO: normal gait, speech and thought processing grossly intact, muscle tone grossly intact throughout  PSYCH: normal affect, pleasant and cooperative  ASSESSMENT AND PLAN:  Discussed the following assessment and plan:  Encounter for preventative adult health care examination - Plan: Hemoglobin A1c, Lipid panel  Screening for depression  Anemia, unspecified type - Plan: CBC  -Discussed and advised all [place] preventive services health task  force level A and B recommendations for age, sex and risks.  -Advised at least 150 minutes of exercise per week and a healthy diet with avoidance of (less then 1 serving per week) processed foods, white starches, red meat, fast foods and sweets and consisting of: * 5-9 servings of fresh fruits and vegetables (not corn or potatoes) *nuts and seeds, beans *olives and olive oil *lean meats such as fish and white chicken  *whole grains  -FASTING labs, studies and vaccines per orders this encounter  Orders Placed This Encounter  Procedures  . CBC    Standing Status:   Future    Standing Expiration Date:   10/07/2016  . Hemoglobin A1c    Standing Status:   Future    Standing Expiration Date:   10/07/2016  . Lipid panel    Standing Status:   Future    Standing Expiration Date:   10/07/2016    Patient advised to return to clinic immediately if symptoms worsen or persist or new concerns.  Patient Instructions  BEFORE YOU LEAVE: -follow up: yearly for physical  Vit D3 [id number] IU daily.  Schedule your gynecology exam with pap and breast exam.  We have ordered labs or studies at this visit. It can take up to 1-2 weeks for results and processing. IF results require follow up or explanation, we will call you with instructions. Clinically stable results will be released to your [facility]. If you have not heard from [place] or cannot find your results in [facility] in 2 weeks please contact our office at [phone].  If you are not yet signed up for [facility], please consider signing up.  Health Maintenance, Female Adopting a healthy lifestyle and getting preventive care can go a long way to promote health and wellness. Talk with your health care provider about what schedule of regular examinations is right for you. This is a good chance for you to check in with your provider about disease prevention and staying healthy. In between checkups, there are plenty of things you can do on your own. Experts have  done a lot of research about which lifestyle changes and preventive measures are most likely to keep you healthy. Ask your health care provider for more information. Weight and diet Eat a healthy diet  Be sure to include plenty of vegetables, fruits, low-fat dairy products, and lean protein.  Do not eat a lot of foods high in solid fats, added sugars, or salt.  Get regular exercise. This is one of the most important things you can do for your health. ? Most adults should exercise for at least 150 minutes each week. The exercise should increase your heart rate and make you sweat (moderate-intensity exercise). ? Most adults should also do strengthening exercises at least twice a week. This is in addition to the moderate-intensity exercise.  Maintain  a healthy weight  Body mass index (BMI) is a measurement that can be used to identify possible weight problems. It estimates body fat based on height and weight. Your health care provider can help determine your BMI and help you achieve or maintain a healthy weight.  For females 72 years of age and older: ? A BMI below 18.5 is considered underweight. ? A BMI of 18.5 to 24.9 is normal. ? A BMI of 25 to 29.9 is considered overweight. ? A BMI of 30 and above is considered obese.  Watch levels of cholesterol and blood lipids  You should start having your blood tested for lipids and cholesterol at 35 years of age, then have this test every 5 years.  You may need to have your cholesterol levels checked more often if: ? Your lipid or cholesterol levels are high. ? You are older than 35 years of age. ? You are at high risk for heart disease.  Cancer screening Lung Cancer  Lung cancer screening is recommended for adults 16-36 years old who are at high risk for lung cancer because of a history of smoking.  A yearly low-dose CT scan of the lungs is recommended for people who: ? Currently smoke. ? Have quit within the past 15 years. ? Have at  least a 30-pack-year history of smoking. A pack year is smoking an average of one pack of cigarettes a day for 1 year.  Yearly screening should continue until it has been 15 years since you quit.  Yearly screening should stop if you develop a health problem that would prevent you from having lung cancer treatment.  Breast Cancer  Practice breast self-awareness. This means understanding how your breasts normally appear and feel.  It also means doing regular breast self-exams. Let your health care provider know about any changes, no matter how small.  If you are in your 20s or 30s, you should have a clinical breast exam (CBE) by a health care provider every 1-3 years as part of a regular health exam.  If you are 29 or older, have a CBE every year. Also consider having a breast X-ray (mammogram) every year.  If you have a family history of breast cancer, talk to your health care provider about genetic screening.  If you are at high risk for breast cancer, talk to your health care provider about having an MRI and a mammogram every year.  Breast cancer gene (BRCA) assessment is recommended for women who have family members with BRCA-related cancers. BRCA-related cancers include: ? Breast. ? Ovarian. ? Tubal. ? Peritoneal cancers.  Results of the assessment will determine the need for genetic counseling and BRCA1 and BRCA2 testing.  Cervical Cancer Your health care provider may recommend that you be screened regularly for cancer of the pelvic organs (ovaries, uterus, and vagina). This screening involves a pelvic examination, including checking for microscopic changes to the surface of your cervix (Pap test). You may be encouraged to have this screening done every 3 years, beginning at age 42.  For women ages 33-65, health care providers may recommend pelvic exams and Pap testing every 3 years, or they may recommend the Pap and pelvic exam, combined with testing for human papilloma virus  (HPV), every 5 years. Some types of HPV increase your risk of cervical cancer. Testing for HPV may also be done on women of any age with unclear Pap test results.  Other health care providers may not recommend any screening for nonpregnant women who  are considered low risk for pelvic cancer and who do not have symptoms. Ask your health care provider if a screening pelvic exam is right for you.  If you have had past treatment for cervical cancer or a condition that could lead to cancer, you need Pap tests and screening for cancer for at least 20 years after your treatment. If Pap tests have been discontinued, your risk factors (such as having a new sexual partner) need to be reassessed to determine if screening should resume. Some women have medical problems that increase the chance of getting cervical cancer. In these cases, your health care provider may recommend more frequent screening and Pap tests.  Colorectal Cancer  This type of cancer can be detected and often prevented.  Routine colorectal cancer screening usually begins at 35 years of age and continues through 35 years of age.  Your health care provider may recommend screening at an earlier age if you have risk factors for colon cancer.  Your health care provider may also recommend using home test kits to check for hidden blood in the stool.  A small camera at the end of a tube can be used to examine your colon directly (sigmoidoscopy or colonoscopy). This is done to check for the earliest forms of colorectal cancer.  Routine screening usually begins at age 31.  Direct examination of the colon should be repeated every 5-10 years through 35 years of age. However, you may need to be screened more often if early forms of precancerous polyps or small growths are found.  Skin Cancer  Check your skin from head to toe regularly.  Tell your health care provider about any new moles or changes in moles, especially if there is a change in a  mole's shape or color.  Also tell your health care provider if you have a mole that is larger than the size of a pencil eraser.  Always use sunscreen. Apply sunscreen liberally and repeatedly throughout the day.  Protect yourself by wearing long sleeves, pants, a wide-brimmed hat, and sunglasses whenever you are outside.  Heart disease, diabetes, and high blood pressure  High blood pressure causes heart disease and increases the risk of stroke. High blood pressure is more likely to develop in: ? People who have blood pressure in the high end of the normal range (130-139/85-89 mm Hg). ? People who are overweight or obese. ? People who are African American.  If you are 60-32 years of age, have your blood pressure checked every 3-5 years. If you are 71 years of age or older, have your blood pressure checked every year. You should have your blood pressure measured twice-once when you are at a hospital or clinic, and once when you are not at a hospital or clinic. Record the average of the two measurements. To check your blood pressure when you are not at a hospital or clinic, you can use: ? An automated blood pressure machine at a pharmacy. ? A home blood pressure monitor.  If you are between 26 years and 48 years old, ask your health care provider if you should take aspirin to prevent strokes.  Have regular diabetes screenings. This involves taking a blood sample to check your fasting blood sugar level. ? If you are at a normal weight and have a low risk for diabetes, have this test once every three years after 35 years of age. ? If you are overweight and have a high risk for diabetes, consider being tested at a  younger age or more often. Preventing infection Hepatitis B  If you have a higher risk for hepatitis B, you should be screened for this virus. You are considered at high risk for hepatitis B if: ? You were born in a country where hepatitis B is common. Ask your health care provider  which countries are considered high risk. ? Your parents were born in a high-risk country, and you have not been immunized against hepatitis B (hepatitis B vaccine). ? You have HIV or AIDS. ? You use needles to inject street drugs. ? You live with someone who has hepatitis B. ? You have had sex with someone who has hepatitis B. ? You get hemodialysis treatment. ? You take certain medicines for conditions, including cancer, organ transplantation, and autoimmune conditions.  Hepatitis C  Blood testing is recommended for: ? Everyone born from [birth year] through 1964. ? Anyone with known risk factors for hepatitis C.  Sexually transmitted infections (STIs)  You should be screened for sexually transmitted infections (STIs) including gonorrhea and chlamydia if: ? You are sexually active and are younger than 35 years of age. ? You are older than 35 years of age and your health care provider tells you that you are at risk for this type of infection. ? Your sexual activity has changed since you were last screened and you are at an increased risk for chlamydia or gonorrhea. Ask your health care provider if you are at risk.  If you do not have HIV, but are at risk, it may be recommended that you take a prescription medicine daily to prevent HIV infection. This is called pre-exposure prophylaxis (PrEP). You are considered at risk if: ? You are sexually active and do not regularly use condoms or know the HIV status of your partner(s). ? You take drugs by injection. ? You are sexually active with a partner who has HIV.  Talk with your health care provider about whether you are at high risk of being infected with HIV. If you choose to begin PrEP, you should first be tested for HIV. You should then be tested every 3 months for as long as you are taking PrEP. Pregnancy  If you are premenopausal and you may become pregnant, ask your health care provider about preconception counseling.  If you may become  pregnant, take 400 to 800 micrograms (mcg) of folic acid every day.  If you want to prevent pregnancy, talk to your health care provider about birth control (contraception). Osteoporosis and menopause  Osteoporosis is a disease in which the bones lose minerals and strength with aging. This can result in serious bone fractures. Your risk for osteoporosis can be identified using a bone density scan.  If you are 2 years of age or older, or if you are at risk for osteoporosis and fractures, ask your health care provider if you should be screened.  Ask your health care provider whether you should take a calcium or vitamin D supplement to lower your risk for osteoporosis.  Menopause may have certain physical symptoms and risks.  Hormone replacement therapy may reduce some of these symptoms and risks. Talk to your health care provider about whether hormone replacement therapy is right for you. Follow these instructions at home:  Schedule regular health, dental, and eye exams.  Stay current with your immunizations.  Do not use any tobacco products including cigarettes, chewing tobacco, or electronic cigarettes.  If you are pregnant, do not drink alcohol.  If you are breastfeeding, limit how  much and how often you drink alcohol.  Limit alcohol intake to no more than 1 drink per day for nonpregnant women. One drink equals 12 ounces of beer, 5 ounces of wine, or 1 ounces of hard liquor.  Do not use street drugs.  Do not share needles.  Ask your health care provider for help if you need support or information about quitting drugs.  Tell your health care provider if you often feel depressed.  Tell your health care provider if you have ever been abused or do not feel safe at home. This information is not intended to replace advice given to you by your health care provider. Make sure you discuss any questions you have with your health care provider. Document Released: 07/20/2010 Document  Revised: 06/12/2015 Document Reviewed: 10/08/2014 Elsevier Interactive Patient Education  2018 [place] NOW OFFER   [facility] FAST TRACK!!!  SAME DAY Appointments for ACUTE CARE  Such as: Sprains, Injuries, cuts, abrasions, rashes, muscle pain, joint pain, back pain Colds, flu, sore throats, headache, allergies, cough, fever  Ear pain, sinus and eye infections Abdominal pain, nausea, vomiting, diarrhea, upset stomach Animal/insect bites  3 Easy Ways to Schedule: Walk-In Scheduling Call in scheduling Mychart Sign-up: [email or web address]               No Follow-up on file.  Colin Benton R., DO

## 2016-09-30 ENCOUNTER — Ambulatory Visit (INDEPENDENT_AMBULATORY_CARE_PROVIDER_SITE_OTHER): Payer: 59 | Admitting: Family Medicine

## 2016-09-30 ENCOUNTER — Encounter: Payer: Self-pay | Admitting: Family Medicine

## 2016-09-30 VITALS — BP 100/68 | HR 67 | Temp 98.5°F | Ht 68.0 in | Wt 171.8 lb

## 2016-09-30 DIAGNOSIS — D649 Anemia, unspecified: Secondary | ICD-10-CM | POA: Diagnosis not present

## 2016-09-30 DIAGNOSIS — Z0000 Encounter for general adult medical examination without abnormal findings: Secondary | ICD-10-CM

## 2016-09-30 DIAGNOSIS — Z1389 Encounter for screening for other disorder: Secondary | ICD-10-CM | POA: Diagnosis not present

## 2016-09-30 DIAGNOSIS — Z1331 Encounter for screening for depression: Secondary | ICD-10-CM

## 2016-09-30 LAB — LIPID PANEL
CHOLESTEROL: 120 mg/dL (ref 0–200)
HDL: 67.3 mg/dL (ref >39.00)
LDL Cholesterol: 42 mg/dL (ref 0–99)
NonHDL: 52.91
Total CHOL/HDL Ratio: 2
Triglycerides: 57 mg/dL (ref 0.0–149.0)
VLDL: 11.4 mg/dL (ref 0.0–40.0)

## 2016-09-30 LAB — CBC
HEMATOCRIT: 38.2 % (ref 36.0–46.0)
Hemoglobin: 12 g/dL (ref 12.0–15.0)
MCHC: 31.4 g/dL (ref 30.0–36.0)
MCV: 94.1 fl (ref 78.0–100.0)
Platelets: 168 10*3/uL (ref 150.0–400.0)
RBC: 4.06 Mil/uL (ref 3.87–5.11)
RDW: 12 % (ref 11.5–15.5)
WBC: 5.1 10*3/uL (ref 4.0–10.5)

## 2016-09-30 LAB — HEMOGLOBIN A1C: Hgb A1c MFr Bld: 4.3 % — ABNORMAL LOW (ref 4.6–6.5)

## 2016-09-30 NOTE — Patient Instructions (Addendum)
BEFORE YOU LEAVE: -follow up: yearly for physical  Vit D3 [phone] IU daily.  Schedule your gynecology exam with pap and breast exam.  We have ordered labs or studies at this visit. It can take up to 1-2 weeks for results and processing. IF results require follow up or explanation, we will call you with instructions. Clinically stable results will be released to your [facility]. If you have not heard from [place] or cannot find your results in [facility] in 2 weeks please contact our office at [phone].  If you are not yet signed up for [facility], please consider signing up.  Health Maintenance, Female Adopting a healthy lifestyle and getting preventive care can go a long way to promote health and wellness. Talk with your health care provider about what schedule of regular examinations is right for you. This is a good chance for you to check in with your provider about disease prevention and staying healthy. In between checkups, there are plenty of things you can do on your own. Experts have done a lot of research about which lifestyle changes and preventive measures are most likely to keep you healthy. Ask your health care provider for more information. Weight and diet Eat a healthy diet  Be sure to include plenty of vegetables, fruits, low-fat dairy products, and lean protein.  Do not eat a lot of foods high in solid fats, added sugars, or salt.  Get regular exercise. This is one of the most important things you can do for your health. ? Most adults should exercise for at least 150 minutes each week. The exercise should increase your heart rate and make you sweat (moderate-intensity exercise). ? Most adults should also do strengthening exercises at least twice a week. This is in addition to the moderate-intensity exercise.  Maintain a healthy weight  Body mass index (BMI) is a measurement that can be used to identify possible weight problems. It estimates body fat based on height and weight. Your  health care provider can help determine your BMI and help you achieve or maintain a healthy weight.  For females 31 years of age and older: ? A BMI below 18.5 is considered underweight. ? A BMI of 18.5 to 24.9 is normal. ? A BMI of 25 to 29.9 is considered overweight. ? A BMI of 30 and above is considered obese.  Watch levels of cholesterol and blood lipids  You should start having your blood tested for lipids and cholesterol at 35 years of age, then have this test every 5 years.  You may need to have your cholesterol levels checked more often if: ? Your lipid or cholesterol levels are high. ? You are older than 35 years of age. ? You are at high risk for heart disease.  Cancer screening Lung Cancer  Lung cancer screening is recommended for adults 68-80 years old who are at high risk for lung cancer because of a history of smoking.  A yearly low-dose CT scan of the lungs is recommended for people who: ? Currently smoke. ? Have quit within the past 15 years. ? Have at least a 30-pack-year history of smoking. A pack year is smoking an average of one pack of cigarettes a day for 1 year.  Yearly screening should continue until it has been 15 years since you quit.  Yearly screening should stop if you develop a health problem that would prevent you from having lung cancer treatment.  Breast Cancer  Practice breast self-awareness. This means understanding how your  breasts normally appear and feel.  It also means doing regular breast self-exams. Let your health care provider know about any changes, no matter how small.  If you are in your 20s or 30s, you should have a clinical breast exam (CBE) by a health care provider every 1-3 years as part of a regular health exam.  If you are 58 or older, have a CBE every year. Also consider having a breast X-ray (mammogram) every year.  If you have a family history of breast cancer, talk to your health care provider about genetic  screening.  If you are at high risk for breast cancer, talk to your health care provider about having an MRI and a mammogram every year.  Breast cancer gene (BRCA) assessment is recommended for women who have family members with BRCA-related cancers. BRCA-related cancers include: ? Breast. ? Ovarian. ? Tubal. ? Peritoneal cancers.  Results of the assessment will determine the need for genetic counseling and BRCA1 and BRCA2 testing.  Cervical Cancer Your health care provider may recommend that you be screened regularly for cancer of the pelvic organs (ovaries, uterus, and vagina). This screening involves a pelvic examination, including checking for microscopic changes to the surface of your cervix (Pap test). You may be encouraged to have this screening done every 3 years, beginning at age 47.  For women ages 49-65, health care providers may recommend pelvic exams and Pap testing every 3 years, or they may recommend the Pap and pelvic exam, combined with testing for human papilloma virus (HPV), every 5 years. Some types of HPV increase your risk of cervical cancer. Testing for HPV may also be done on women of any age with unclear Pap test results.  Other health care providers may not recommend any screening for nonpregnant women who are considered low risk for pelvic cancer and who do not have symptoms. Ask your health care provider if a screening pelvic exam is right for you.  If you have had past treatment for cervical cancer or a condition that could lead to cancer, you need Pap tests and screening for cancer for at least 20 years after your treatment. If Pap tests have been discontinued, your risk factors (such as having a new sexual partner) need to be reassessed to determine if screening should resume. Some women have medical problems that increase the chance of getting cervical cancer. In these cases, your health care provider may recommend more frequent screening and Pap  tests.  Colorectal Cancer  This type of cancer can be detected and often prevented.  Routine colorectal cancer screening usually begins at 35 years of age and continues through 35 years of age.  Your health care provider may recommend screening at an earlier age if you have risk factors for colon cancer.  Your health care provider may also recommend using home test kits to check for hidden blood in the stool.  A small camera at the end of a tube can be used to examine your colon directly (sigmoidoscopy or colonoscopy). This is done to check for the earliest forms of colorectal cancer.  Routine screening usually begins at age 73.  Direct examination of the colon should be repeated every 5-10 years through 35 years of age. However, you may need to be screened more often if early forms of precancerous polyps or small growths are found.  Skin Cancer  Check your skin from head to toe regularly.  Tell your health care provider about any new moles or changes in  moles, especially if there is a change in a mole's shape or color.  Also tell your health care provider if you have a mole that is larger than the size of a pencil eraser.  Always use sunscreen. Apply sunscreen liberally and repeatedly throughout the day.  Protect yourself by wearing long sleeves, pants, a wide-brimmed hat, and sunglasses whenever you are outside.  Heart disease, diabetes, and high blood pressure  High blood pressure causes heart disease and increases the risk of stroke. High blood pressure is more likely to develop in: ? People who have blood pressure in the high end of the normal range (130-139/85-89 mm Hg). ? People who are overweight or obese. ? People who are African American.  If you are 25-34 years of age, have your blood pressure checked every 3-5 years. If you are 64 years of age or older, have your blood pressure checked every year. You should have your blood pressure measured twice-once when you are at  a hospital or clinic, and once when you are not at a hospital or clinic. Record the average of the two measurements. To check your blood pressure when you are not at a hospital or clinic, you can use: ? An automated blood pressure machine at a pharmacy. ? A home blood pressure monitor.  If you are between 48 years and 27 years old, ask your health care provider if you should take aspirin to prevent strokes.  Have regular diabetes screenings. This involves taking a blood sample to check your fasting blood sugar level. ? If you are at a normal weight and have a low risk for diabetes, have this test once every three years after 35 years of age. ? If you are overweight and have a high risk for diabetes, consider being tested at a younger age or more often. Preventing infection Hepatitis B  If you have a higher risk for hepatitis B, you should be screened for this virus. You are considered at high risk for hepatitis B if: ? You were born in a country where hepatitis B is common. Ask your health care provider which countries are considered high risk. ? Your parents were born in a high-risk country, and you have not been immunized against hepatitis B (hepatitis B vaccine). ? You have HIV or AIDS. ? You use needles to inject street drugs. ? You live with someone who has hepatitis B. ? You have had sex with someone who has hepatitis B. ? You get hemodialysis treatment. ? You take certain medicines for conditions, including cancer, organ transplantation, and autoimmune conditions.  Hepatitis C  Blood testing is recommended for: ? Everyone born from [birth year] through 1964. ? Anyone with known risk factors for hepatitis C.  Sexually transmitted infections (STIs)  You should be screened for sexually transmitted infections (STIs) including gonorrhea and chlamydia if: ? You are sexually active and are younger than 35 years of age. ? You are older than 35 years of age and your health care provider tells  you that you are at risk for this type of infection. ? Your sexual activity has changed since you were last screened and you are at an increased risk for chlamydia or gonorrhea. Ask your health care provider if you are at risk.  If you do not have HIV, but are at risk, it may be recommended that you take a prescription medicine daily to prevent HIV infection. This is called pre-exposure prophylaxis (PrEP). You are considered at risk if: ? You are  sexually active and do not regularly use condoms or know the HIV status of your partner(s). ? You take drugs by injection. ? You are sexually active with a partner who has HIV.  Talk with your health care provider about whether you are at high risk of being infected with HIV. If you choose to begin PrEP, you should first be tested for HIV. You should then be tested every 3 months for as long as you are taking PrEP. Pregnancy  If you are premenopausal and you may become pregnant, ask your health care provider about preconception counseling.  If you may become pregnant, take 400 to 800 micrograms (mcg) of folic acid every day.  If you want to prevent pregnancy, talk to your health care provider about birth control (contraception). Osteoporosis and menopause  Osteoporosis is a disease in which the bones lose minerals and strength with aging. This can result in serious bone fractures. Your risk for osteoporosis can be identified using a bone density scan.  If you are 71 years of age or older, or if you are at risk for osteoporosis and fractures, ask your health care provider if you should be screened.  Ask your health care provider whether you should take a calcium or vitamin D supplement to lower your risk for osteoporosis.  Menopause may have certain physical symptoms and risks.  Hormone replacement therapy may reduce some of these symptoms and risks. Talk to your health care provider about whether hormone replacement therapy is right for  you. Follow these instructions at home:  Schedule regular health, dental, and eye exams.  Stay current with your immunizations.  Do not use any tobacco products including cigarettes, chewing tobacco, or electronic cigarettes.  If you are pregnant, do not drink alcohol.  If you are breastfeeding, limit how much and how often you drink alcohol.  Limit alcohol intake to no more than 1 drink per day for nonpregnant women. One drink equals 12 ounces of beer, 5 ounces of wine, or 1 ounces of hard liquor.  Do not use street drugs.  Do not share needles.  Ask your health care provider for help if you need support or information about quitting drugs.  Tell your health care provider if you often feel depressed.  Tell your health care provider if you have ever been abused or do not feel safe at home. This information is not intended to replace advice given to you by your health care provider. Make sure you discuss any questions you have with your health care provider. Document Released: 07/20/2010 Document Revised: 06/12/2015 Document Reviewed: 10/08/2014 Elsevier Interactive Patient Education  2018 [place] NOW OFFER   [facility] FAST TRACK!!!  SAME DAY Appointments for ACUTE CARE  Such as: Sprains, Injuries, cuts, abrasions, rashes, muscle pain, joint pain, back pain Colds, flu, sore throats, headache, allergies, cough, fever  Ear pain, sinus and eye infections Abdominal pain, nausea, vomiting, diarrhea, upset stomach Animal/insect bites  3 Easy Ways to Schedule: Walk-In Scheduling Call in scheduling Mychart Sign-up: [email or web address]

## 2016-10-07 ENCOUNTER — Encounter: Payer: Self-pay | Admitting: Family Medicine

## 2016-11-16 DIAGNOSIS — Z01419 Encounter for gynecological examination (general) (routine) without abnormal findings: Secondary | ICD-10-CM | POA: Diagnosis not present

## 2016-12-10 DIAGNOSIS — J209 Acute bronchitis, unspecified: Secondary | ICD-10-CM | POA: Diagnosis not present

## 2016-12-22 DIAGNOSIS — Z0100 Encounter for examination of eyes and vision without abnormal findings: Secondary | ICD-10-CM | POA: Diagnosis not present

## 2017-01-27 ENCOUNTER — Encounter: Payer: Self-pay | Admitting: Family Medicine

## 2017-04-23 DIAGNOSIS — S338XXD Sprain of other parts of lumbar spine and pelvis, subsequent encounter: Secondary | ICD-10-CM | POA: Diagnosis not present

## 2017-04-23 DIAGNOSIS — J020 Streptococcal pharyngitis: Secondary | ICD-10-CM | POA: Diagnosis not present

## 2017-04-27 ENCOUNTER — Encounter: Payer: Self-pay | Admitting: Family Medicine

## 2017-05-05 ENCOUNTER — Ambulatory Visit: Payer: 59 | Admitting: Family Medicine

## 2017-09-01 DIAGNOSIS — H04123 Dry eye syndrome of bilateral lacrimal glands: Secondary | ICD-10-CM | POA: Diagnosis not present

## 2017-10-06 NOTE — Progress Notes (Signed)
HPI:  Using dictation device. Unfortunately this device frequently misinterprets words/phrases.  Here for CPE:  -Concerns and/or follow up today: except feels tired at times. Feels is likely due to just a lot gong on, lots of activities with 3 children. No fevers, malaise, night sweats, wt loss or other symptoms. Wants to check labs. Due for flu shot  -Diet: variety of foods, balance and well rounded, larger portion sizes -Exercise: no regular exercise -Taking folic acid, vitamin D or calcium: no -Diabetes and Dyslipidemia Screening: done last year and normal -Vaccines: see vaccine section EPIC -pap history[facility] -FDLMP: see nursing notes -sexual activity: yes, female partner, no new partners -wants STI testing (Hep C if born [birth year]): no -FH breast, colon or ovarian ca: see FH Last mammogram: sees [facility] for breast exams Last colon cancer screening: n/a Breast Ca Risk Assessment: see family history and pt history DEXA (>/= 65): n/a  -Alcohol, Tobacco, drug use: see social history  Review of Systems - no fevers, unintentional weight loss, vision loss, hearing loss, chest pain, sob, hemoptysis, melena, hematochezia, hematuria, genital discharge, changing or concerning skin lesions, bleeding, bruising, loc, thoughts of self harm or SI  Past Medical History:  Diagnosis Date  . HSV infection   . SAB (spontaneous abortion) 2010   x 2 - both in 2010    Past Surgical History:  Procedure Laterality Date  . CESAREAN SECTION  2011 and 2013   x 2 both in [place], [place]  . CESAREAN SECTION N/A 10/21/2013   Procedure: CESAREAN SECTION;  Surgeon: Allena Katz, MD;  Location: [facility];  Service: Obstetrics;  Laterality: N/A;  . WISDOM TOOTH EXTRACTION      Family History  Problem Relation Age of Onset  . Diabetes Mother   . Multiple sclerosis Mother   . Diabetes Maternal Grandmother   . Hypertension Maternal Grandmother   . Cancer  Maternal Aunt 41    Social History   Socioeconomic History  . Marital status: Married    Spouse name: Not on file  . Number of children: Not on file  . Years of education: Not on file  . Highest education level: Not on file  Occupational History  . Not on file  Social Needs  . Financial resource strain: Not on file  . Food insecurity:    Worry: Not on file    Inability: Not on file  . Transportation needs:    Medical: Not on file    Non-medical: Not on file  Tobacco Use  . Smoking status: Never Smoker  . Smokeless tobacco: Never Used  Substance and Sexual Activity  . Alcohol use: No    Alcohol/week: 0.0 standard drinks  . Drug use: No  . Sexual activity: Yes    Birth control/protection: None    Comment: pregnant  Lifestyle  . Physical activity:    Days per week: Not on file    Minutes per session: Not on file  . Stress: Not on file  Relationships  . Social connections:    Talks on phone: Not on file    Gets together: Not on file    Attends religious service: Not on file    Active member of club or organization: Not on file    Attends meetings of clubs or organizations: Not on file    Relationship status: Not on file  Other Topics Concern  . 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  SPRINTEC 28 0.25-35 MG-MCG tablet, See admin instructions., Disp: , Rfl: 3  EXAM:  Vitals:   10/10/17 0834  BP: 118/78  Pulse: 62  Temp: 98.2 F (36.8 C)    GENERAL: vitals reviewed and listed below, alert, oriented, appears well hydrated and in no acute distress  HEENT: head atraumatic, PERRLA, normal appearance of eyes, ears, nose and mouth. moist mucus membranes.  NECK:  supple, no masses or lymphadenopathy  LUNGS: clear to auscultation bilaterally, no rales, rhonchi or wheeze  CV: HRRR, no peripheral edema or cyanosis, normal pedal pulses  ABDOMEN: bowel sounds normal, soft, non tender to palpation, no masses, no rebound or guarding  GU/BREAST: sees gyn, declined  SKIN: no rash or abnormal lesions  MS: normal gait, moves all extremities normally  NEURO: normal gait, speech and thought processing grossly intact, muscle tone grossly intact throughout  PSYCH: normal affect, pleasant and cooperative  ASSESSMENT AND PLAN:  Discussed the following assessment and plan:  PREVENTIVE EXAM: -Discussed and advised all [place] preventive services health task force level A and B recommendations for age, sex and risks. -Advised at least 150 minutes of exercise per week and a healthy diet with avoidance of (less then 1 serving per week) processed foods, white starches, red meat, fast foods and sweets and consisting of: * 5-9 servings of fresh fruits and vegetables (not corn or potatoes) *nuts and seeds, beans *olives and olive oil *lean meats such as fish and white chicken  *whole grains -labs (feeling tired so will do labs to assess - advised follow up if persists, worsens, new symptoms), studies and vaccines per orders this encounter   Patient advised to return to clinic immediately if symptoms worsen or persist or new concerns.  Patient Instructions  BEFORE YOU LEAVE: -flu shot -labs -follow up: yearly and as needed  Take [phone] IU vit D3 in the winter months  We have ordered labs or studies at this visit. It can take up to 1-2 weeks for results and processing. IF results require follow up or explanation, we will call you with instructions. Clinically stable results will be released to your [facility]. If you have not heard from [place] or cannot find your results in [facility] in 2 weeks please contact our office at [phone].  If you are not yet signed up for  [facility], please consider signing up.   We recommend the following healthy lifestyle for LIFE: 1) Small portions. But, make sure to get regular (at least 3 per day), healthy meals and small healthy snacks if needed.  2) Eat a healthy clean diet.   TRY TO EAT: -at least 5-7 servings of low sugar, colorful, and nutrient rich vegetables per day (not corn, potatoes or bananas.) -berries are the best choice if you wish to eat fruit (only eat small amounts if trying to reduce weight)  -lean meets (fish, white meat of chicken or [place]) -vegan proteins for some meals - beans or tofu, whole grains, nuts and seeds -Replace bad fats with good fats - good fats include: fish, nuts and seeds, canola oil, olive  oil -small amounts of low fat or non fat dairy -small amounts of100 % whole grains - check the lables -drink plenty of water  AVOID: -SUGAR, sweets, anything with added sugar, corn syrup or sweeteners - must read labels as even foods advertised as "healthy" often are loaded with sugar -if you must have a sweetener, small amounts of stevia may be best -sweetened beverages and artificially sweetened beverages -simple starches (rice, bread, potatoes, pasta, chips, etc - small amounts of 100% whole grains are ok) -red meat, pork, butter -fried foods, fast food, processed food, excessive dairy, eggs and coconut.  3)Get at least 150 minutes of sweaty aerobic exercise per week.  4)Reduce stress - consider counseling, meditation and relaxation to balance other aspects of your life.   Preventive Care 18-39 Years, Female Preventive care refers to lifestyle choices and visits with your health care provider that can promote health and wellness. What does preventive care include?  A yearly physical exam. This is also called an annual well check.  Dental exams once or twice a year.  Routine eye exams. Ask your health care provider how often you should have your eyes checked.  Personal lifestyle  choices, including: ? Daily care of your teeth and gums. ? Regular physical activity. ? Eating a healthy diet. ? Avoiding tobacco and drug use. ? Limiting alcohol use. ? Practicing safe sex. ? Taking vitamin and mineral supplements as recommended by your health care provider. What happens during an annual well check? The services and screenings done by your health care provider during your annual well check will depend on your age, overall health, lifestyle risk factors, and family history of disease. Counseling Your health care provider may ask you questions about your:  Alcohol use.  Tobacco use.  Drug use.  Emotional well-being.  Home and relationship well-being.  Sexual activity.  Eating habits.  Work and work Statistician.  Method of birth control.  Menstrual cycle.  Pregnancy history.  Screening You may have the following tests or measurements:  Height, weight, and BMI.  Diabetes screening. This is done by checking your blood sugar (glucose) after you have not eaten for a while (fasting).  Blood pressure.  Lipid and cholesterol levels. These may be checked every 5 years starting at age 29.  Skin check.  Hepatitis C blood test.  Hepatitis B blood test.  Sexually transmitted disease (STD) testing.  BRCA-related cancer screening. This may be done if you have a family history of breast, ovarian, tubal, or peritoneal cancers.  Pelvic exam and Pap test. This may be done every 3 years starting at age 69. Starting at age 17, this may be done every 5 years if you have a Pap test in combination with an HPV test.  Discuss your test results, treatment options, and if necessary, the need for more tests with your health care provider. Vaccines Your health care provider may recommend certain vaccines, such as:  Influenza vaccine. This is recommended every year.  Tetanus, diphtheria, and acellular pertussis (Tdap, Td) vaccine. You may need a Td booster every 10  years.  Varicella vaccine. You may need this if you have not been vaccinated.  HPV vaccine. If you are 52 or younger, you may need three doses over 6 months.  Measles, mumps, and rubella (MMR) vaccine. You may need at least one dose of MMR. You may also need a second dose.  Pneumococcal 13-valent conjugate (PCV13) vaccine. You may need this if you have certain conditions and were not  previously vaccinated.  Pneumococcal polysaccharide (PPSV23) vaccine. You may need one or two doses if you smoke cigarettes or if you have certain conditions.  Meningococcal vaccine. One dose is recommended if you are age 7-21 years and a first-year college student living in a residence hall, or if you have one of several medical conditions. You may also need additional booster doses.  Hepatitis A vaccine. You may need this if you have certain conditions or if you travel or work in places where you may be exposed to hepatitis A.  Hepatitis B vaccine. You may need this if you have certain conditions or if you travel or work in places where you may be exposed to hepatitis B.  Haemophilus influenzae type b (Hib) vaccine. You may need this if you have certain risk factors.  Talk to your health care provider about which screenings and vaccines you need and how often you need them. This information is not intended to replace advice given to you by your health care provider. Make sure you discuss any questions you have with your health care provider. Document Released: 03/02/2001 Document Revised: 09/24/2015 Document Reviewed: 11/05/2014 Elsevier Interactive Patient Education  2018 [employer].         No follow-ups on file.  Lucretia Kern, DO

## 2017-10-10 ENCOUNTER — Encounter: Payer: Self-pay | Admitting: Family Medicine

## 2017-10-10 ENCOUNTER — Ambulatory Visit (INDEPENDENT_AMBULATORY_CARE_PROVIDER_SITE_OTHER): Payer: 59 | Admitting: Family Medicine

## 2017-10-10 VITALS — BP 118/78 | HR 62 | Temp 98.2°F | Ht 67.75 in | Wt 176.3 lb

## 2017-10-10 DIAGNOSIS — Z23 Encounter for immunization: Secondary | ICD-10-CM

## 2017-10-10 DIAGNOSIS — Z1331 Encounter for screening for depression: Secondary | ICD-10-CM

## 2017-10-10 DIAGNOSIS — R5383 Other fatigue: Secondary | ICD-10-CM

## 2017-10-10 DIAGNOSIS — Z0000 Encounter for general adult medical examination without abnormal findings: Secondary | ICD-10-CM | POA: Diagnosis not present

## 2017-10-10 LAB — BASIC METABOLIC PANEL
BUN: 13 mg/dL (ref 6–23)
CO2: 28 mEq/L (ref 19–32)
Calcium: 9.4 mg/dL (ref 8.4–10.5)
Chloride: 104 mEq/L (ref 96–112)
Creatinine, Ser: 0.71 mg/dL (ref 0.40–1.20)
GFR: 119.6 mL/min (ref >60.00)
Glucose, Bld: 76 mg/dL (ref 70–99)
Potassium: 4.2 mEq/L (ref 3.5–5.1)
SODIUM: 139 meq/L (ref 135–145)

## 2017-10-10 LAB — VITAMIN B12: Vitamin B-12: 663 pg/mL (ref 211–911)

## 2017-10-10 LAB — CBC
HCT: 37 % (ref 36.0–46.0)
Hemoglobin: 12 g/dL (ref 12.0–15.0)
MCHC: 32.6 g/dL (ref 30.0–36.0)
MCV: 91.9 fl (ref 78.0–100.0)
Platelets: 174 10*3/uL (ref 150.0–400.0)
RBC: 4.02 Mil/uL (ref 3.87–5.11)
RDW: 12.5 % (ref 11.5–15.5)
WBC: 4.8 10*3/uL (ref 4.0–10.5)

## 2017-10-10 LAB — VITAMIN D 25 HYDROXY (VIT D DEFICIENCY, FRACTURES): VITD: 20.33 ng/mL — AB (ref 30.00–100.00)

## 2017-10-10 LAB — TSH: TSH: 0.47 u[IU]/mL (ref 0.35–4.50)

## 2017-10-10 NOTE — Addendum Note (Signed)
Addended by: Agnes Lawrence on: 10/10/2017 09:12 AM   Modules accepted: Orders

## 2017-10-10 NOTE — Patient Instructions (Signed)
BEFORE YOU LEAVE: -flu shot -labs -follow up: yearly and as needed  Take [phone] IU vit D3 in the winter months  We have ordered labs or studies at this visit. It can take up to 1-2 weeks for results and processing. IF results require follow up or explanation, we will call you with instructions. Clinically stable results will be released to your [facility]. If you have not heard from [place] or cannot find your results in [facility] in 2 weeks please contact our office at [phone].  If you are not yet signed up for [facility], please consider signing up.   We recommend the following healthy lifestyle for LIFE: 1) Small portions. But, make sure to get regular (at least 3 per day), healthy meals and small healthy snacks if needed.  2) Eat a healthy clean diet.   TRY TO EAT: -at least 5-7 servings of low sugar, colorful, and nutrient rich vegetables per day (not corn, potatoes or bananas.) -berries are the best choice if you wish to eat fruit (only eat small amounts if trying to reduce weight)  -lean meets (fish, white meat of chicken or [place]) -vegan proteins for some meals - beans or tofu, whole grains, nuts and seeds -Replace bad fats with good fats - good fats include: fish, nuts and seeds, canola oil, olive oil -small amounts of low fat or non fat dairy -small amounts of100 % whole grains - check the lables -drink plenty of water  AVOID: -SUGAR, sweets, anything with added sugar, corn syrup or sweeteners - must read labels as even foods advertised as "healthy" often are loaded with sugar -if you must have a sweetener, small amounts of stevia may be best -sweetened beverages and artificially sweetened beverages -simple starches (rice, bread, potatoes, pasta, chips, etc - small amounts of 100% whole grains are ok) -red meat, pork, butter -fried foods, fast food, processed food, excessive dairy, eggs and coconut.  3)Get at least 150 minutes of sweaty aerobic exercise per week.  4)Reduce  stress - consider counseling, meditation and relaxation to balance other aspects of your life.   Preventive Care 18-39 Years, Female Preventive care refers to lifestyle choices and visits with your health care provider that can promote health and wellness. What does preventive care include?  A yearly physical exam. This is also called an annual well check.  Dental exams once or twice a year.  Routine eye exams. Ask your health care provider how often you should have your eyes checked.  Personal lifestyle choices, including: ? Daily care of your teeth and gums. ? Regular physical activity. ? Eating a healthy diet. ? Avoiding tobacco and drug use. ? Limiting alcohol use. ? Practicing safe sex. ? Taking vitamin and mineral supplements as recommended by your health care provider. What happens during an annual well check? The services and screenings done by your health care provider during your annual well check will depend on your age, overall health, lifestyle risk factors, and family history of disease. Counseling Your health care provider may ask you questions about your:  Alcohol use.  Tobacco use.  Drug use.  Emotional well-being.  Home and relationship well-being.  Sexual activity.  Eating habits.  Work and work Statistician.  Method of birth control.  Menstrual cycle.  Pregnancy history.  Screening You may have the following tests or measurements:  Height, weight, and BMI.  Diabetes screening. This is done by checking your blood sugar (glucose) after you have not eaten for a while (fasting).  Blood  pressure.  Lipid and cholesterol levels. These may be checked every 5 years starting at age 87.  Skin check.  Hepatitis C blood test.  Hepatitis B blood test.  Sexually transmitted disease (STD) testing.  BRCA-related cancer screening. This may be done if you have a family history of breast, ovarian, tubal, or peritoneal cancers.  Pelvic exam and Pap  test. This may be done every 3 years starting at age 61. Starting at age 12, this may be done every 5 years if you have a Pap test in combination with an HPV test.  Discuss your test results, treatment options, and if necessary, the need for more tests with your health care provider. Vaccines Your health care provider may recommend certain vaccines, such as:  Influenza vaccine. This is recommended every year.  Tetanus, diphtheria, and acellular pertussis (Tdap, Td) vaccine. You may need a Td booster every 10 years.  Varicella vaccine. You may need this if you have not been vaccinated.  HPV vaccine. If you are 33 or younger, you may need three doses over 6 months.  Measles, mumps, and rubella (MMR) vaccine. You may need at least one dose of MMR. You may also need a second dose.  Pneumococcal 13-valent conjugate (PCV13) vaccine. You may need this if you have certain conditions and were not previously vaccinated.  Pneumococcal polysaccharide (PPSV23) vaccine. You may need one or two doses if you smoke cigarettes or if you have certain conditions.  Meningococcal vaccine. One dose is recommended if you are age 25-21 years and a first-year college student living in a residence hall, or if you have one of several medical conditions. You may also need additional booster doses.  Hepatitis A vaccine. You may need this if you have certain conditions or if you travel or work in places where you may be exposed to hepatitis A.  Hepatitis B vaccine. You may need this if you have certain conditions or if you travel or work in places where you may be exposed to hepatitis B.  Haemophilus influenzae type b (Hib) vaccine. You may need this if you have certain risk factors.  Talk to your health care provider about which screenings and vaccines you need and how often you need them. This information is not intended to replace advice given to you by your health care provider. Make sure you discuss any questions  you have with your health care provider. Document Released: 03/02/2001 Document Revised: 09/24/2015 Document Reviewed: 11/05/2014 Elsevier Interactive Patient Education  [employer].

## 2017-10-26 DIAGNOSIS — J029 Acute pharyngitis, unspecified: Secondary | ICD-10-CM | POA: Diagnosis not present

## 2017-11-17 DIAGNOSIS — Z01419 Encounter for gynecological examination (general) (routine) without abnormal findings: Secondary | ICD-10-CM | POA: Diagnosis not present

## 2017-12-14 DIAGNOSIS — N760 Acute vaginitis: Secondary | ICD-10-CM | POA: Diagnosis not present

## 2017-12-15 DIAGNOSIS — N760 Acute vaginitis: Secondary | ICD-10-CM | POA: Diagnosis not present

## 2017-12-21 ENCOUNTER — Ambulatory Visit: Payer: Self-pay | Admitting: *Deleted

## 2017-12-21 NOTE — Telephone Encounter (Signed)
Called pt regarding MyChart message for right upper quad pain. Pt has had the pain for about 2 weeks. It comes and goes. Pain # about a 3 or 4. Some nausea. Right upper quad with some tenderness. Sometimes the pain will go around to her back. She denies fever, diarrhea or chest pain. Nothing makes it worst.  Appointment already scheduled for tomorrow. Pt will keep that appointment. Advised to call back for increase in symptoms. Pt voiced understanding. Routing to flow at [facility] at [facility]  Reason for Disposition . [1] MILD pain (e.g., does not interfere with normal activities) AND [2] comes and goes (cramps) AND [3] present > 72 hours  Answer Assessment - Initial Assessment Questions 1. LOCATION: "Where does it hurt?"      Right upper quad 2. RADIATION: "Does the pain shoot anywhere else?" (e.g., chest, back)     Sometimes around to her back 3. ONSET: "When did the pain begin?" (e.g., minutes, hours or days ago)      2 weeks ago 4. SUDDEN: "Gradual or sudden onset?"     gradual 5. PATTERN "Does the pain come and go, or is it constant?"    - If constant: "Is it getting better, staying the same, or worsening?"      (Note: Constant means the pain never goes away completely; most serious pain is constant and it progresses)     - If intermittent: "How long does it last?" "Do you have pain now?"     (Note: Intermittent means the pain goes away completely between bouts)     Come and go 6. SEVERITY: "How bad is the pain?"  (e.g., Scale 1-10; mild, moderate, or severe)    - MILD (1-3): doesn't interfere with normal activities, abdomen soft and not tender to touch     - MODERATE (4-7): interferes with normal activities or awakens from sleep, tender to touch     - SEVERE (8-10): excruciating pain, doubled over, unable to do any normal activities       Pain # 3 or 4 and tender to touch 7. RECURRENT SYMPTOM: "Have you ever had this type of abdominal pain before?" If so, ask: "When was the last  time?" and "What happened that time?"      no 8. AGGRAVATING FACTORS: "Does anything seem to cause this pain?" (e.g., foods, stress, alcohol)     no 9. CARDIAC SYMPTOMS: "Do you have any of the following symptoms: chest pain, difficulty breathing, sweating, nausea?"     Nausea at times 10. OTHER SYMPTOMS: "Do you have any other symptoms?" (e.g., fever, vomiting, diarrhea)       no 11. PREGNANCY: "Is there any chance you are pregnant?" "When was your last menstrual period?"       No. LMP was November  Protocols used: ABDOMINAL PAIN - UPPER-A-AH

## 2017-12-22 ENCOUNTER — Encounter: Payer: Self-pay | Admitting: Family Medicine

## 2017-12-22 ENCOUNTER — Ambulatory Visit: Payer: 59 | Admitting: Family Medicine

## 2017-12-22 VITALS — BP 108/72 | HR 74 | Temp 98.4°F | Ht 67.75 in | Wt 178.3 lb

## 2017-12-22 DIAGNOSIS — R1011 Right upper quadrant pain: Secondary | ICD-10-CM | POA: Diagnosis not present

## 2017-12-22 LAB — CBC
HCT: 39.9 % (ref 36.0–46.0)
Hemoglobin: 12.8 g/dL (ref 12.0–15.0)
MCHC: 32 g/dL (ref 30.0–36.0)
MCV: 92.8 fl (ref 78.0–100.0)
PLATELETS: 203 10*3/uL (ref 150.0–400.0)
RBC: 4.3 Mil/uL (ref 3.87–5.11)
RDW: 12.8 % (ref 11.5–15.5)
WBC: 6.8 10*3/uL (ref 4.0–10.5)

## 2017-12-22 LAB — COMPREHENSIVE METABOLIC PANEL
ALK PHOS: 60 U/L (ref 39–117)
ALT: 15 U/L (ref 0–35)
AST: 21 U/L (ref 0–37)
Albumin: 4.4 g/dL (ref 3.5–5.2)
BILIRUBIN TOTAL: 0.6 mg/dL (ref 0.2–1.2)
BUN: 13 mg/dL (ref 6–23)
CO2: 28 mEq/L (ref 19–32)
Calcium: 9.8 mg/dL (ref 8.4–10.5)
Chloride: 101 mEq/L (ref 96–112)
Creatinine, Ser: 0.73 mg/dL (ref 0.40–1.20)
GFR: 115.69 mL/min (ref >60.00)
GLUCOSE: 84 mg/dL (ref 70–99)
POTASSIUM: 4.1 meq/L (ref 3.5–5.1)
Sodium: 137 mEq/L (ref 135–145)
TOTAL PROTEIN: 7.9 g/dL (ref 6.0–8.3)

## 2017-12-22 NOTE — Progress Notes (Signed)
  HPI:  Using dictation device. Unfortunately this device frequently misinterprets words/phrases.  Acute visit for RUQ pain: -x 2 weeks, intermittent, lasts a few seconds when she notices it, mild discomfort RUQ and rib area -no trigers, no changes in activities - active, runner -no fevers, malaise, NVD, constipation, melena, dysuria, hematochezia, wt loss -periods regular and FDLMP 11/11 -denies chance of pregnanacy  ROS: See pertinent positives and negatives per HPI.  Past Medical History:  Diagnosis Date  . HSV infection   . SAB (spontaneous abortion) 2010   x 2 - both in 2010    Past Surgical History:  Procedure Laterality Date  . CESAREAN SECTION  2011 and 2013   x 2 both in [place], [place]  . CESAREAN SECTION N/A 10/21/2013   Procedure: CESAREAN SECTION;  Surgeon: Allena Katz, MD;  Location: [facility];  Service: Obstetrics;  Laterality: N/A;  . WISDOM TOOTH EXTRACTION      Family History  Problem Relation Age of Onset  . Diabetes Mother   . Multiple sclerosis Mother   . Diabetes Maternal Grandmother   . Hypertension Maternal Grandmother   . Cancer Maternal Aunt 32    SOCIAL HX: see hpi   Current Outpatient Medications:  .  SPRINTEC 28 0.25-35 MG-MCG tablet, See admin instructions., Disp: , Rfl: 3  EXAM:  Vitals:   12/22/17 1405  BP: 108/72  Pulse: 74  Temp: 98.4 F (36.9 C)  SpO2: 99%    Body mass index is 27.31 kg/m.  GENERAL: vitals reviewed and listed above, alert, oriented, appears well hydrated and in no acute distress  HEENT: atraumatic, conjunttiva clear, no obvious abnormalities on inspection of external nose and ears  NECK: no obvious masses on inspection  LUNGS: clear to auscultation bilaterally, no wheezes, rales or rhonchi, good air movement  CV: HRRR, no peripheral edema  ABD: BS+, soft, NTTP, no rebound or guarding  MS: moves all extremities without noticeable abnormality, mild TTP soft tissue ove post thoracic rib  cage  PSYCH: pleasant and cooperative, no obvious depression or anxiety  ASSESSMENT AND PLAN:  Discussed the following assessment and plan:  RUQ pain - Plan: Comprehensive metabolic panel, CBC  -we discussed possible serious and likely etiologies, workup and treatment, treatment risks and return precautions - ? musculoskeletal -after this discussion, Aniyiah opted for labs per orders to eval for other, stretching, heat, over the counter analgesic if needed -follow up advised 2 weeks, sooner if needed - if worsening or not improving RUQ [place] -of course, we advised Yelitza  to return or notify a doctor immediately if symptoms worsen or persist or new concerns arise.   Patient Instructions  BEFORE YOU LEAVE: -labs -follow up: 2 weeks  Gentle stretching, heat. Tylenol or aleve if needed per instructions.  I hope you are feeling better soon! Seek care sooner if your symptoms worsen, new concerns arise or you are not improving with treatment.  We have ordered labs or studies at this visit. It can take up to 1-2 weeks for results and processing. IF results require follow up or explanation, we will call you with instructions. Clinically stable results will be released to your [facility]. If you have not heard from [place] or cannot find your results in [facility] in 2 weeks please contact our office at [phone].  If you are not yet signed up for [facility], please consider signing up.          Lucretia Kern, DO

## 2017-12-22 NOTE — Patient Instructions (Addendum)
BEFORE YOU LEAVE: -labs -follow up: 2 weeks  Gentle stretching, heat. Tylenol or aleve if needed per instructions.  I hope you are feeling better soon! Seek care sooner if your symptoms worsen, new concerns arise or you are not improving with treatment.  We have ordered labs or studies at this visit. It can take up to 1-2 weeks for results and processing. IF results require follow up or explanation, we will call you with instructions. Clinically stable results will be released to your [facility]. If you have not heard from [place] or cannot find your results in [facility] in 2 weeks please contact our office at [phone].  If you are not yet signed up for [facility], please consider signing up.

## 2018-01-05 ENCOUNTER — Ambulatory Visit: Payer: 59 | Admitting: Family Medicine

## 2018-01-15 DIAGNOSIS — L609 Nail disorder, unspecified: Secondary | ICD-10-CM | POA: Diagnosis not present

## 2018-05-17 DIAGNOSIS — L728 Other follicular cysts of the skin and subcutaneous tissue: Secondary | ICD-10-CM | POA: Diagnosis not present

## 2018-09-29 ENCOUNTER — Encounter: Payer: Self-pay | Admitting: Family Medicine

## 2018-09-29 ENCOUNTER — Telehealth (INDEPENDENT_AMBULATORY_CARE_PROVIDER_SITE_OTHER): Payer: 59 | Admitting: Family Medicine

## 2018-09-29 ENCOUNTER — Other Ambulatory Visit: Payer: Self-pay

## 2018-09-29 DIAGNOSIS — Z131 Encounter for screening for diabetes mellitus: Secondary | ICD-10-CM

## 2018-09-29 DIAGNOSIS — Z13220 Encounter for screening for lipoid disorders: Secondary | ICD-10-CM | POA: Diagnosis not present

## 2018-09-29 DIAGNOSIS — Z862 Personal history of diseases of the blood and blood-forming organs and certain disorders involving the immune mechanism: Secondary | ICD-10-CM

## 2018-09-29 NOTE — Progress Notes (Signed)
Virtual Visit via Video Note  I connected with Sarah Sherman   on 09/29/18 at 10:30 AM EDT by a video enabled telemedicine application and verified that I am speaking with the correct person using two identifiers.  Location patient: home Location provider:work office Persons participating in the virtual visit: patient, provider  I discussed the limitations of evaluation and management by telemedicine and the availability of in person appointments. The patient expressed understanding and agreed to proceed.   Sarah Sherman DOB: [birth year] Encounter date: 09/29/2018  This is a 38 y.o. female who presents to establish care.   History of present illness: No specific concerns today.   Follows for gyn care at [facility]. Last visit with them was last year; has appointment next month. Last pap was 2018.   Has 3 children, 9,6,4.   Past Medical History:  Diagnosis Date  . HSV infection   . SAB (spontaneous abortion) 2010   x 2 - both in 2010   Past Surgical History:  Procedure Laterality Date  . CESAREAN SECTION  2011 and 2013   x 2 both in [place], [place]  . CESAREAN SECTION N/A 10/21/2013   Procedure: CESAREAN SECTION;  Surgeon: Allena Katz, MD;  Location: [facility];  Service: Obstetrics;  Laterality: N/A;  . WISDOM TOOTH EXTRACTION     No Known Allergies No outpatient medications have been marked as taking for the 09/29/18 encounter (Telemedicine) with Caren Macadam, MD.   Social History   Tobacco Use  . Smoking status: Never Smoker  . Smokeless tobacco: Never Used  Substance Use Topics  . Alcohol use: No    Alcohol/week: 0.0 standard drinks   Family History  Problem Relation Age of Onset  . Diabetes Mother   . Multiple sclerosis Mother        progressive  . Diabetes Maternal Grandmother   . Hypertension Maternal Grandmother   . Cancer Maternal Aunt 40  . Other Father        back pain; injury in [employer]  . Diabetes Brother   . Heart disease  Maternal Grandfather   . Other Paternal Grandmother        brain tumor  . Lung cancer Paternal Grandmother        smoker  . Heart attack Paternal Grandfather 69  . Healthy Brother      Review of Systems  Constitutional: Negative for chills, fatigue and fever.  Respiratory: Negative for cough, chest tightness, shortness of breath and wheezing.   Cardiovascular: Negative for chest pain, palpitations and leg swelling.    Objective:  There were no vitals taken for this visit.      BP Readings from Last 3 Encounters:  12/22/17 108/72  10/10/17 118/78  09/30/16 100/68   Wt Readings from Last 3 Encounters:  12/22/17 178 lb 4.8 oz (80.9 kg)  10/10/17 176 lb 4.8 oz (80 kg)  09/30/16 171 lb 12.8 oz (77.9 kg)    EXAM:  GENERAL: alert, oriented, appears well and in no acute distress  HEENT: atraumatic, conjunctiva clear, no obvious abnormalities on inspection of external nose and ears  NECK: normal movements of the head and neck  LUNGS: on inspection no signs of respiratory distress, breathing rate appears normal, no obvious gross SOB, gasping or wheezing  CV: no obvious cyanosis  MS: moves all visible extremities without noticeable abnormality  PSYCH/NEURO: pleasant and cooperative, no obvious depression or anxiety, speech and thought processing grossly intact  SKIN: no facial or  neck abnormalities.   Assessment/Plan  1. Lipid screening Consider bloodwork prior to next physical.  2. Screening for diabetes mellitus bloodwork ordered to complete. OK to wait until COVID situation has improved.   Return for physical exam in 6 months.      I discussed the assessment and treatment plan with the patient. The patient was provided an opportunity to ask questions and all were answered. The patient agreed with the plan and demonstrated an understanding of the instructions.   The patient was advised to call back or seek an in-person evaluation if the symptoms worsen or if  the condition fails to improve as anticipated.  I provided 15 minutes of non-face-to-face time during this encounter.   Micheline Rough, MD

## 2018-10-03 NOTE — Progress Notes (Signed)
I left a detailed message at the pts cell number to call for an appt as below.

## 2018-10-12 ENCOUNTER — Encounter: Payer: 59 | Admitting: Family Medicine

## 2019-03-28 ENCOUNTER — Other Ambulatory Visit: Payer: Self-pay

## 2019-03-28 ENCOUNTER — Ambulatory Visit (INDEPENDENT_AMBULATORY_CARE_PROVIDER_SITE_OTHER): Payer: 59 | Admitting: Family Medicine

## 2019-03-28 ENCOUNTER — Encounter: Payer: Self-pay | Admitting: Family Medicine

## 2019-03-28 VITALS — BP 108/78 | HR 82 | Temp 97.9°F | Ht 68.0 in | Wt 180.0 lb

## 2019-03-28 DIAGNOSIS — R1011 Right upper quadrant pain: Secondary | ICD-10-CM | POA: Diagnosis not present

## 2019-03-28 DIAGNOSIS — M545 Low back pain, unspecified: Secondary | ICD-10-CM

## 2019-03-28 LAB — POCT URINALYSIS DIPSTICK
Bilirubin, UA: NEGATIVE
Blood, UA: NEGATIVE
Glucose, UA: NEGATIVE
Ketones, UA: NEGATIVE
Leukocytes, UA: NEGATIVE
Nitrite, UA: NEGATIVE
Protein, UA: NEGATIVE
Spec Grav, UA: 1.01 (ref 1.010–1.025)
Urobilinogen, UA: 0.2 E.U./dL
pH, UA: 8 (ref 5.0–8.0)

## 2019-03-28 NOTE — Patient Instructions (Signed)
We will set up abdominal ultrasound to further assess.

## 2019-03-28 NOTE — Progress Notes (Signed)
  Subjective:     Patient ID: Sarah Sherman, female   DOB: [birth year], 39 y.o.   MRN: [id number]  HPI   Sudha is seen with initial complaints of low back pain.  On further questioning pain is almost more in the flank region but she is having some right upper quadrant abdominal pain just under her rib cage area that radiates toward the back for several months.  She had occasional nausea but no vomiting.  No known injury.  She has some nonspecific lightheadedness intermittently.  She does not have any known history of gallstones.  She tends to eat fairly healthy and does not eat a lot of high-fat foods but has not noted any particular food triggers.  Does not take any regular medications.  Past Medical History:  Diagnosis Date  . HSV infection   . SAB (spontaneous abortion) 2010   x 2 - both in 2010   Past Surgical History:  Procedure Laterality Date  . CESAREAN SECTION  2011 and 2013   x 2 both in [place], [place]  . CESAREAN SECTION N/A 10/21/2013   Procedure: CESAREAN SECTION;  Surgeon: Allena Katz, MD;  Location: [facility];  Service: Obstetrics;  Laterality: N/A;  . WISDOM TOOTH EXTRACTION      reports that she has never smoked. She has never used smokeless tobacco. She reports that she does not drink alcohol or use drugs. family history includes Cancer (age of onset: 51) in her maternal aunt; Diabetes in her brother, maternal grandmother, and mother; Healthy in her brother; Heart attack (age of onset: 39) in her paternal grandfather; Heart disease in her maternal grandfather; Hypertension in her maternal grandmother; Lung cancer in her paternal grandmother; Multiple sclerosis in her mother; Other in her father and paternal grandmother. No Known Allergies   Review of Systems  Constitutional: Negative for chills, fever and unexpected weight change.  Respiratory: Negative for shortness of breath.   Cardiovascular: Negative for chest pain.  Gastrointestinal: Positive for  abdominal pain. Negative for vomiting.  Genitourinary: Negative for dysuria and hematuria.  Skin: Negative for rash.  Hematological: Negative for adenopathy.       Objective:   Physical Exam Vitals reviewed.  Constitutional:      Appearance: Normal appearance.  Abdominal:     Comments: Normal bowel sounds.  Nondistended.  Mild tenderness right upper quadrant deep palpation.  No hepatomegaly.  No masses.  No splenomegaly.  No guarding or rebound.  Neurological:     Mental Status: She is alert.        Assessment:     #1 intermittent right upper quadrant abdominal pain with radiation toward the back.  Rule out symptomatic gallstones.    Plan:     -Check CBC and comprehensive metabolic panel -Set up abdominal ultrasound to further assess -follow up immediately or go to ER for any fever or progressive abdominal pain.  Eulas Post MD [facility] at [facility]

## 2019-03-29 LAB — COMPREHENSIVE METABOLIC PANEL
ALT: 13 U/L (ref 0–35)
AST: 20 U/L (ref 0–37)
Albumin: 4.2 g/dL (ref 3.5–5.2)
Alkaline Phosphatase: 75 U/L (ref 39–117)
BUN: 9 mg/dL (ref 6–23)
CO2: 29 mEq/L (ref 19–32)
Calcium: 9.8 mg/dL (ref 8.4–10.5)
Chloride: 104 mEq/L (ref 96–112)
Creatinine, Ser: 0.72 mg/dL (ref 0.40–1.20)
GFR: 109.84 mL/min (ref >60.00)
Glucose, Bld: 99 mg/dL (ref 70–99)
Potassium: 4.4 mEq/L (ref 3.5–5.1)
Sodium: 139 mEq/L (ref 135–145)
Total Bilirubin: 0.6 mg/dL (ref 0.2–1.2)
Total Protein: 7.5 g/dL (ref 6.0–8.3)

## 2019-03-29 LAB — CBC WITH DIFFERENTIAL/PLATELET
Basophils Absolute: 0.1 10*3/uL (ref 0.0–0.1)
Basophils Relative: 1.1 % (ref 0.0–3.0)
Eosinophils Absolute: 0 10*3/uL (ref 0.0–0.7)
Eosinophils Relative: 0.3 % (ref 0.0–5.0)
HCT: 35.8 % — ABNORMAL LOW (ref 36.0–46.0)
Hemoglobin: 11.6 g/dL — ABNORMAL LOW (ref 12.0–15.0)
Lymphocytes Relative: 27.2 % (ref 12.0–46.0)
Lymphs Abs: 2.3 10*3/uL (ref 0.7–4.0)
MCHC: 32.3 g/dL (ref 30.0–36.0)
MCV: 91.5 fl (ref 78.0–100.0)
Monocytes Absolute: 0.5 10*3/uL (ref 0.1–1.0)
Monocytes Relative: 6 % (ref 3.0–12.0)
Neutro Abs: 5.5 10*3/uL (ref 1.4–7.7)
Neutrophils Relative %: 65.4 % (ref 43.0–77.0)
Platelets: 196 10*3/uL (ref 150.0–400.0)
RBC: 3.91 Mil/uL (ref 3.87–5.11)
RDW: 12.8 % (ref 11.5–15.5)
WBC: 8.5 10*3/uL (ref 4.0–10.5)

## 2019-03-30 ENCOUNTER — Telehealth: Payer: Self-pay | Admitting: Family Medicine

## 2019-03-30 ENCOUNTER — Ambulatory Visit
Admission: RE | Admit: 2019-03-30 | Discharge: 2019-03-30 | Disposition: A | Discharge status: Home / Self Care | Payer: 59 | Source: Ambulatory Visit | Attending: Family Medicine | Admitting: Family Medicine

## 2019-03-30 DIAGNOSIS — R1011 Right upper quadrant pain: Secondary | ICD-10-CM

## 2019-03-30 IMAGING — US US ABDOMEN COMPLETE
1 series · 14 of 25 positions shown · non-contrast
Comparison: None.

CLINICAL DATA: Intermittent right upper quadrant abdominal pain

EXAM:
ABDOMEN ULTRASOUND COMPLETE

[Series 1: us abdomen complete · 0.15mm/px · 14 of 72 slices shown]
[im 1/72]
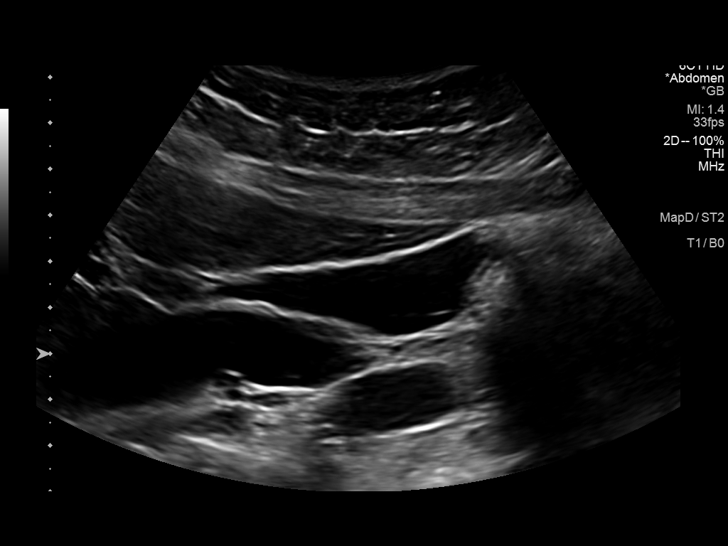
[im 6/72]
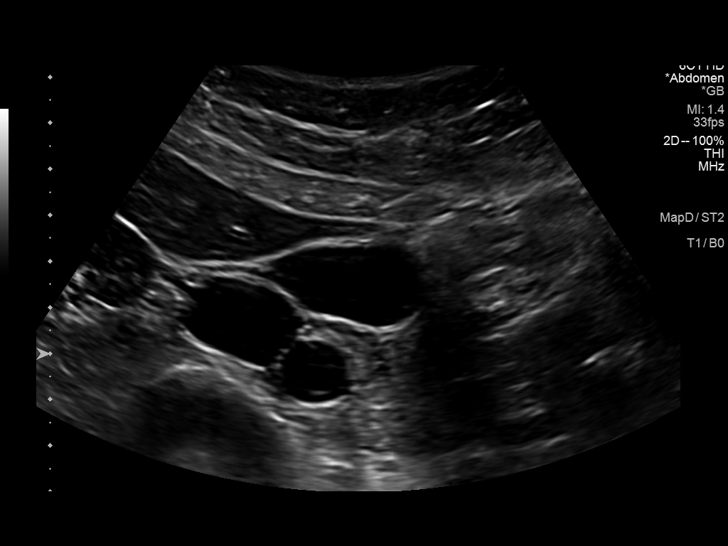
[im 12/72]
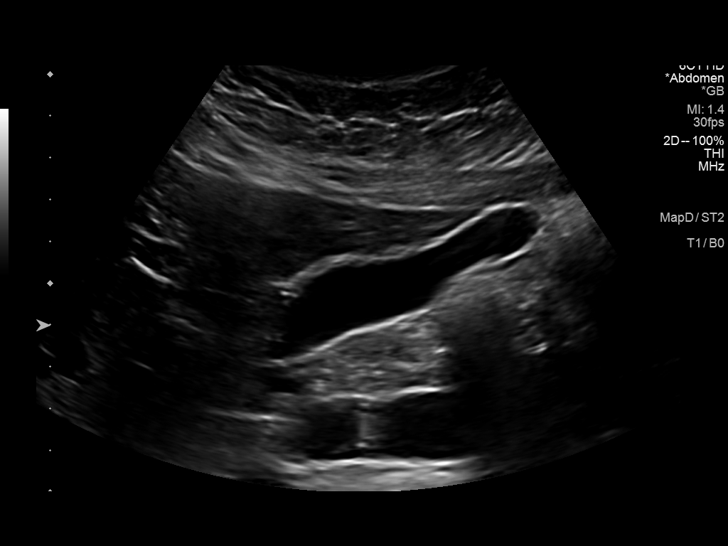
[im 18/72]
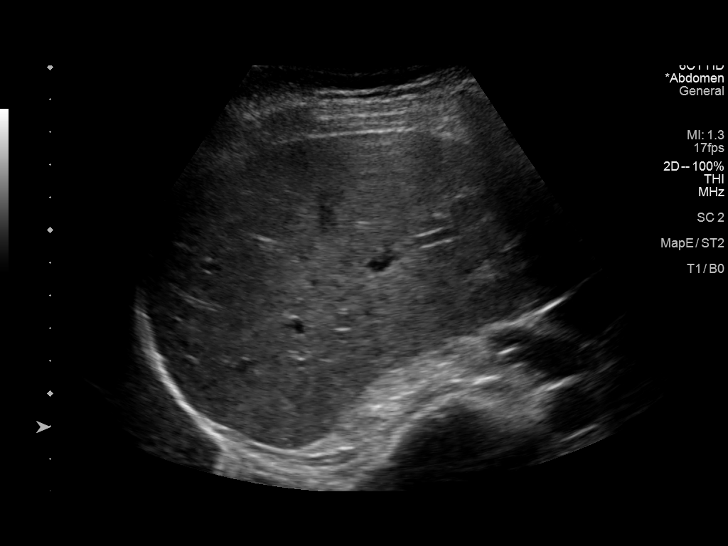
[im 24/72]
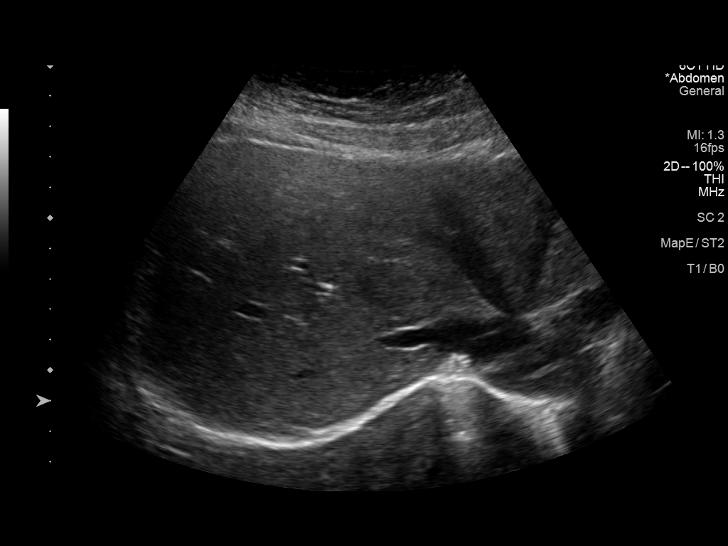
[im 27/72]
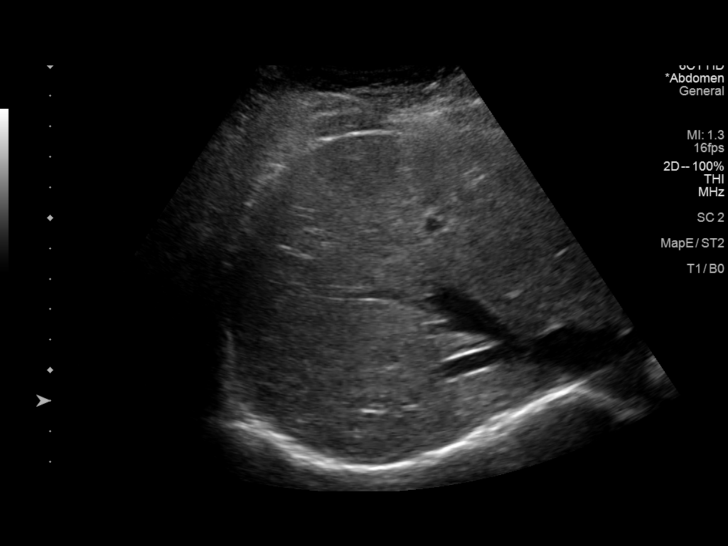
[im 33/72]
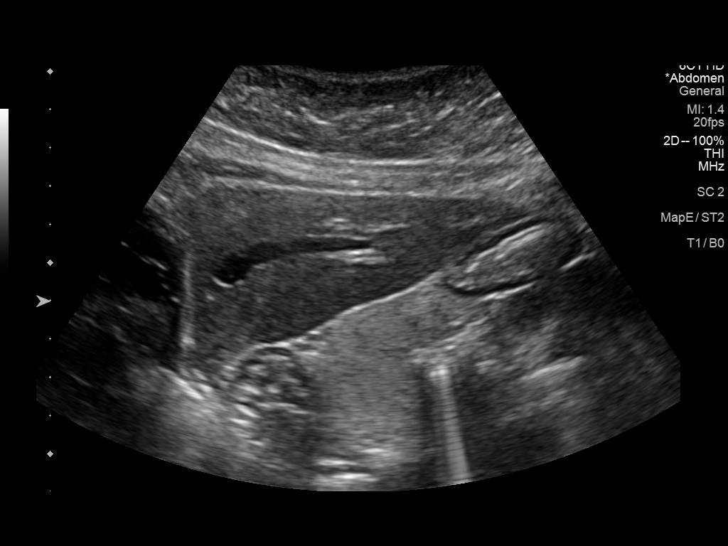
[im 39/72]
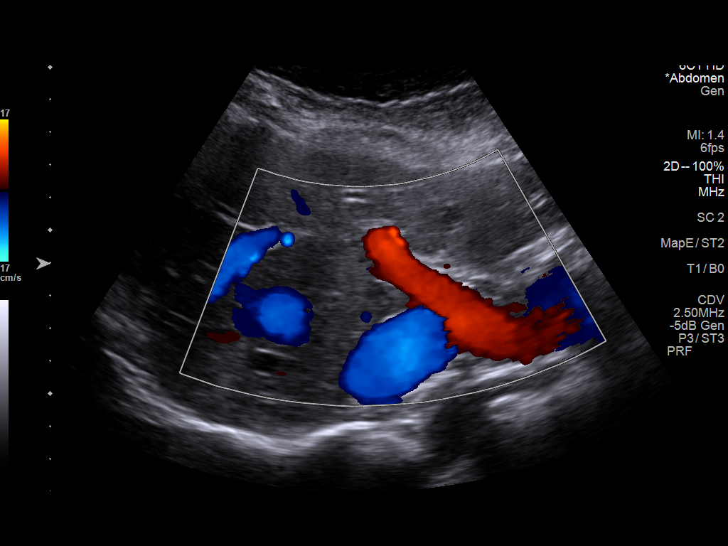
[im 45/72]
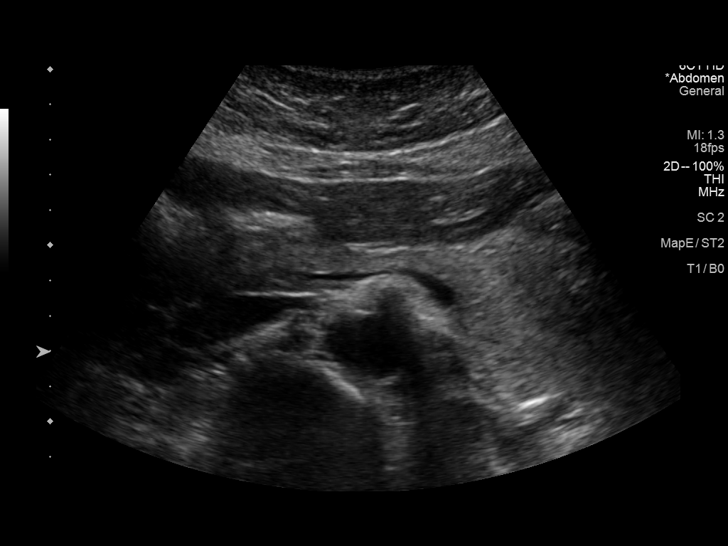
[im 48/72]
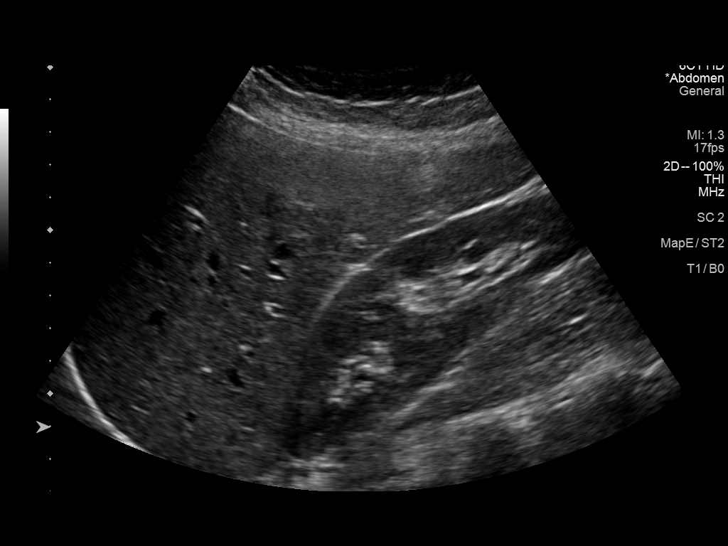
[im 54/72]
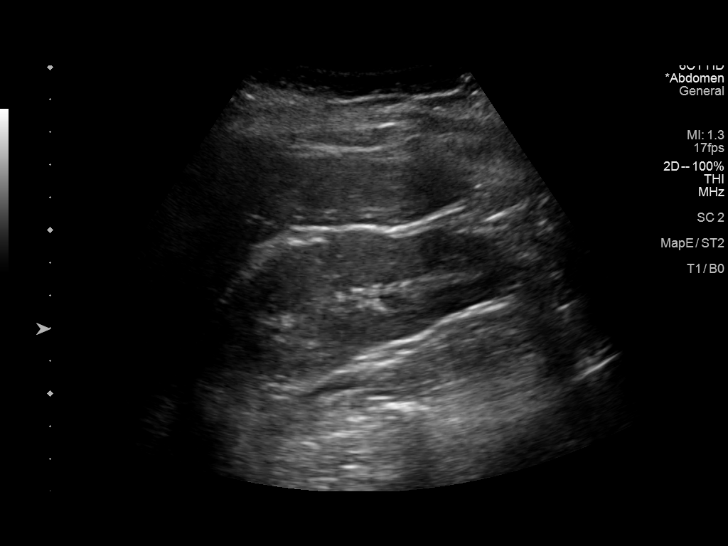
[im 60/72]
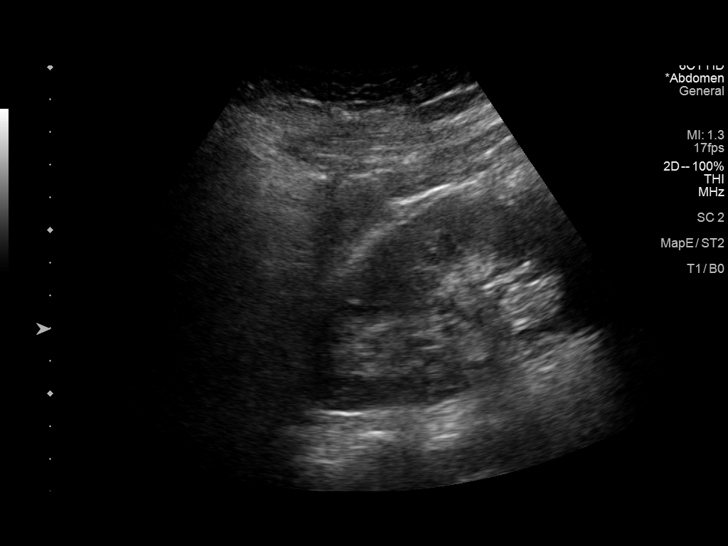
[im 66/72]
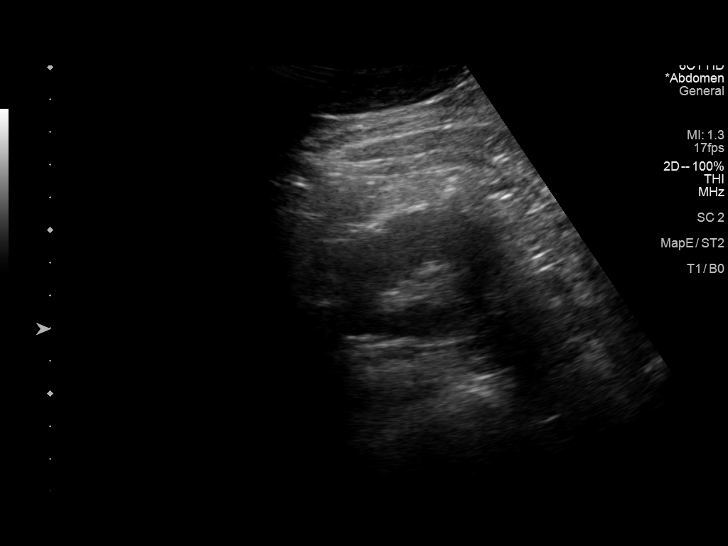
[im 72/72]
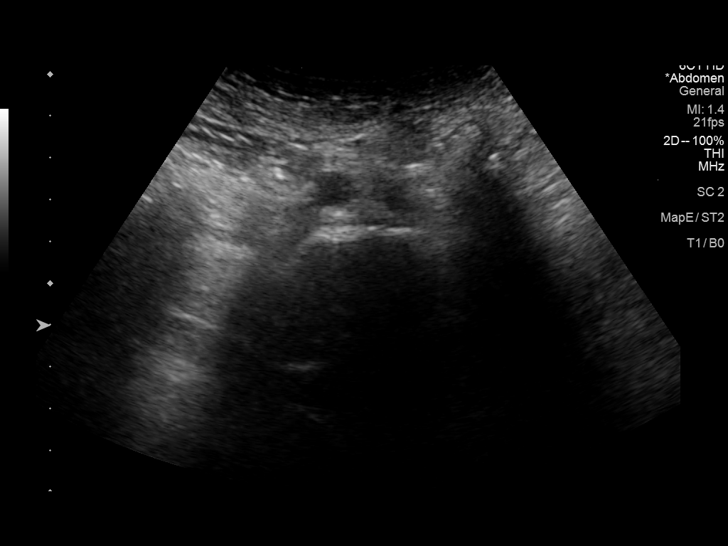

[14 of 25 positions shown; findings below may reference images not displayed]

FINDINGS: Gallbladder: No gallstones, gallbladder wall thickening, or
pericholecystic fluid. Negative sonographic Murphy's sign.

Common bile duct: Diameter: 3 mm

Liver: No focal lesion identified. At the upper limits of normal for
parenchymal echogenicity. Portal vein is patent on color Doppler
imaging with normal direction of blood flow towards the liver.

IVC: No abnormality visualized.

Pancreas: Visualized portion unremarkable.

Spleen: Size and appearance within normal limits.

Right Kidney: Length: 12.4 cm. Echogenicity within normal limits. No
mass or hydronephrosis visualized.

Left Kidney: Length: 10.4 cm. Echogenicity within normal limits. No
mass or hydronephrosis visualized.

Abdominal aorta: No aneurysm visualized.

Other findings: None.
IMPRESSION: Negative abdominal ultrasound.

Specifically, no evidence of cholelithiasis.

## 2019-03-30 NOTE — Telephone Encounter (Signed)
Called patient and gave her lab results. Patient verbalized an understanding.

## 2019-03-30 NOTE — Telephone Encounter (Signed)
Pt returning call. Pt states, it's okay, to leave a message on her voicemail. Thanks

## 2019-04-26 ENCOUNTER — Encounter: Payer: Self-pay | Admitting: Family Medicine

## 2019-04-26 ENCOUNTER — Telehealth (INDEPENDENT_AMBULATORY_CARE_PROVIDER_SITE_OTHER): Payer: 59 | Admitting: Family Medicine

## 2019-04-26 VITALS — Wt 175.0 lb

## 2019-04-26 DIAGNOSIS — L299 Pruritus, unspecified: Secondary | ICD-10-CM

## 2019-04-26 DIAGNOSIS — N6452 Nipple discharge: Secondary | ICD-10-CM | POA: Diagnosis not present

## 2019-04-26 DIAGNOSIS — N644 Mastodynia: Secondary | ICD-10-CM | POA: Diagnosis not present

## 2019-04-26 NOTE — Progress Notes (Signed)
Virtual Visit via Video Note  I connected with Sarah Sherman  on 04/26/19 at 12:00 PM EDT by a video enabled telemedicine application and verified that I am speaking with the correct person using two identifiers.  Location patient: home, [place] Location provider:work or home office Persons participating in the virtual visit: patient, provider  I discussed the limitations of evaluation and management by telemedicine and the availability of in person appointments. The patient expressed understanding and agreed to proceed.   HPI:  Acute visit for breast complaint: -R breast issues -she saw her ob/gyn doctor about this about one month ago as was having some itching around the nipple - they gave her some yeast cream and a steroid cream -they did a dermatology referral as well and she will see the dermatologist tomorrow -they also did a mammogram and that was normal -a few days ago she noticed the R breast felt full and she had some nipple discomfort and soreness. When she was looking at her nipple she noticed a small bump on the nipple which she squeezed and white stuff came out "like popping a pimple"  -has not noticed any other discharge, now the nipple feels better, feels well o/w -denies history of breast issues, FH breast issues/ca   ROS: See pertinent positives and negatives per HPI.  Past Medical History:  Diagnosis Date  . HSV infection   . SAB (spontaneous abortion) 2010   x 2 - both in 2010    Past Surgical History:  Procedure Laterality Date  . CESAREAN SECTION  2011 and 2013   x 2 both in [place], [place]  . CESAREAN SECTION N/A 10/21/2013   Procedure: CESAREAN SECTION;  Surgeon: Allena Katz, MD;  Location: [facility];  Service: Obstetrics;  Laterality: N/A;  . WISDOM TOOTH EXTRACTION      Family History  Problem Relation Age of Onset  . Diabetes Mother   . Multiple sclerosis Mother        progressive  . Diabetes Maternal Grandmother   . Hypertension Maternal Grandmother    . Cancer Maternal Aunt 4  . Other Father        back pain; injury in [employer]  . Diabetes Brother   . Heart disease Maternal Grandfather   . Other Paternal Grandmother        brain tumor  . Lung cancer Paternal Grandmother        smoker  . Heart attack Paternal Grandfather 38  . Healthy Brother     SOCIAL HX: see hpi   Current Outpatient Medications:  .  ibuprofen (ADVIL) 800 MG tablet, Take by mouth., Disp: , Rfl:  .  nystatin ointment (MYCOSTATIN), Apply 1 application topically as needed. , Disp: , Rfl:  .  SPRINTEC 28 0.25-35 MG-MCG tablet, See admin instructions., Disp: , Rfl: 3 .  triamcinolone ointment (KENALOG) 0.1 %, Apply 1 application topically 2 (two) times daily., Disp: , Rfl:   EXAM:  VITALS per patient if applicable:  GENERAL: alert, oriented, appears well and in no acute distress  HEENT: atraumatic, conjunttiva clear, no obvious abnormalities on inspection of external nose and ears  NECK: normal movements of the head and neck  LUNGS: on inspection no signs of respiratory distress, breathing rate appears normal, no obvious gross SOB, gasping or wheezing  CV: no obvious cyanosis  BREAST: not done, video visit  MS: moves all visible extremities without noticeable abnormality  PSYCH/NEURO: pleasant and cooperative, no obvious depression or anxiety, speech and thought processing  grossly intact  ASSESSMENT AND PLAN:  Discussed the following assessment and plan:  Pruritus  Breast pain  Discharge from breast  -we discussed possible serious and likely etiologies, options for evaluation and workup, limitations of telemedicine visit vs in person visit, treatment, treatment risks and precautions. Pt prefers to treat via telemedicine empirically rather then risking or undertaking an in person visit at this moment. Discussed various causes of breast pain, pruritis and ? discharge vs purulent lesion. Advised keeping appt with derm tomorrow and follow up in  person re-exam with her gyn doctor given ongoing and new breast issues. Patient agrees and will see derm as planned tomorrow and schedule a follow up with her gyn in the next few weeks. Advised to seek prompt in person care if worsening or new symptoms arise in the interim.   I discussed the assessment and treatment plan with the patient. The patient was provided an opportunity to ask questions and all were answered. The patient agreed with the plan and demonstrated an understanding of the instructions.   The patient was advised to call back or seek an in-person evaluation if the symptoms worsen or if the condition fails to improve as anticipated.   Sarah Kern, DO

## 2019-07-16 ENCOUNTER — Encounter: Payer: Self-pay | Admitting: Family Medicine

## 2019-07-16 ENCOUNTER — Ambulatory Visit (INDEPENDENT_AMBULATORY_CARE_PROVIDER_SITE_OTHER): Payer: 59 | Admitting: Family Medicine

## 2019-07-16 ENCOUNTER — Other Ambulatory Visit: Payer: Self-pay

## 2019-07-16 VITALS — BP 90/68 | HR 82 | Temp 98.2°F | Ht 67.75 in | Wt 171.4 lb

## 2019-07-16 DIAGNOSIS — Z800 Family history of malignant neoplasm of digestive organs: Secondary | ICD-10-CM

## 2019-07-16 DIAGNOSIS — Z0000 Encounter for general adult medical examination without abnormal findings: Secondary | ICD-10-CM

## 2019-07-16 DIAGNOSIS — D649 Anemia, unspecified: Secondary | ICD-10-CM

## 2019-07-16 DIAGNOSIS — Z13220 Encounter for screening for lipoid disorders: Secondary | ICD-10-CM | POA: Diagnosis not present

## 2019-07-16 LAB — CBC WITH DIFFERENTIAL/PLATELET
Basophils Absolute: 0 10*3/uL (ref 0.0–0.1)
Basophils Relative: 0.7 % (ref 0.0–3.0)
Eosinophils Absolute: 0.1 10*3/uL (ref 0.0–0.7)
Eosinophils Relative: 1 % (ref 0.0–5.0)
HCT: 33.6 % — ABNORMAL LOW (ref 36.0–46.0)
Hemoglobin: 10.9 g/dL — ABNORMAL LOW (ref 12.0–15.0)
Lymphocytes Relative: 35.1 % (ref 12.0–46.0)
Lymphs Abs: 2 10*3/uL (ref 0.7–4.0)
MCHC: 32.4 g/dL (ref 30.0–36.0)
MCV: 91.2 fl (ref 78.0–100.0)
Monocytes Absolute: 0.3 10*3/uL (ref 0.1–1.0)
Monocytes Relative: 5.6 % (ref 3.0–12.0)
Neutro Abs: 3.3 10*3/uL (ref 1.4–7.7)
Neutrophils Relative %: 57.6 % (ref 43.0–77.0)
Platelets: 157 10*3/uL (ref 150.0–400.0)
RBC: 3.68 Mil/uL — ABNORMAL LOW (ref 3.87–5.11)
RDW: 12.8 % (ref 11.5–15.5)
WBC: 5.8 10*3/uL (ref 4.0–10.5)

## 2019-07-16 LAB — IBC + FERRITIN
Ferritin: 21.6 ng/mL (ref 10.0–291.0)
Iron: 58 ug/dL (ref 42–145)
Saturation Ratios: 17.7 % — ABNORMAL LOW (ref 20.0–50.0)
Transferrin: 234 mg/dL (ref 212.0–360.0)

## 2019-07-16 LAB — LIPID PANEL
Cholesterol: 138 mg/dL (ref 0–200)
HDL: 50.4 mg/dL (ref >39.00)
LDL Cholesterol: 77 mg/dL (ref 0–99)
NonHDL: 87.54
Total CHOL/HDL Ratio: 3
Triglycerides: 54 mg/dL (ref 0.0–149.0)
VLDL: 10.8 mg/dL (ref 0.0–40.0)

## 2019-07-16 NOTE — Progress Notes (Signed)
ZORAIDA HAVRILLA DOB: 09/15/1980 Encounter date: 07/16/2019  This is a 39 y.o. female who presents for complete physical   History of present illness/Additional concerns: Doing well overall. Exercises regularly, eats well.   Follows with gyn at [facility].  Past Medical History:  Diagnosis Date  . HSV infection   . SAB (spontaneous abortion) 2010   x 2 - both in 2010   Past Surgical History:  Procedure Laterality Date  . CESAREAN SECTION  2011 and 2013   x 2 both in [place], [place]  . CESAREAN SECTION N/A 10/21/2013   Procedure: CESAREAN SECTION;  Surgeon: Allena Katz, MD;  Location: [facility];  Service: Obstetrics;  Laterality: N/A;  . WISDOM TOOTH EXTRACTION     No Known Allergies Current Meds  Medication Sig  . [DISCONTINUED] ibuprofen (ADVIL) 800 MG tablet Take by mouth.  . [DISCONTINUED] nystatin ointment (MYCOSTATIN) Apply 1 application topically as needed.   . [DISCONTINUED] SPRINTEC 28 0.25-35 MG-MCG tablet See admin instructions.  . [DISCONTINUED] triamcinolone ointment (KENALOG) 0.1 % Apply 1 application topically 2 (two) times daily.   Social History   Tobacco Use  . Smoking status: Never Smoker  . Smokeless tobacco: Never Used  Substance Use Topics  . Alcohol use: No    Alcohol/week: 0.0 standard drinks   Family History  Problem Relation Age of Onset  . Diabetes Mother   . Multiple sclerosis Mother        progressive  . Diabetes Maternal Grandmother   . Hypertension Maternal Grandmother   . Colon cancer Maternal Aunt 71  . Other Father        back pain; injury in [employer]  . Diabetes Brother   . Heart disease Maternal Grandfather   . Other Paternal Grandmother        brain tumor  . Lung cancer Paternal Grandmother        smoker  . Heart attack Paternal Grandfather 42  . Healthy Brother      Review of Systems  Constitutional: Negative for activity change, appetite change, chills, fatigue, fever and unexpected weight change.  HENT: Negative  for congestion, ear pain, hearing loss, sinus pressure, sinus pain, sore throat and trouble swallowing.   Eyes: Negative for pain and visual disturbance.  Respiratory: Negative for cough, chest tightness, shortness of breath and wheezing.   Cardiovascular: Negative for chest pain, palpitations and leg swelling.  Gastrointestinal: Negative for abdominal pain, blood in stool, constipation, diarrhea, nausea and vomiting.  Genitourinary: Negative for difficulty urinating and menstrual problem.  Musculoskeletal: Negative for arthralgias and back pain.  Skin: Negative for rash.  Neurological: Negative for dizziness, weakness, numbness and headaches.  Hematological: Negative for adenopathy. Does not bruise/bleed easily.  Psychiatric/Behavioral: Negative for sleep disturbance and suicidal ideas. The patient is not nervous/anxious.     CBC:  Lab Results  Component Value Date   WBC 8.5 03/28/2019   HGB 11.6 (L) 03/28/2019   HCT 35.8 (L) 03/28/2019   MCH 29.9 10/22/2013   MCHC 32.3 03/28/2019   RDW 12.8 03/28/2019   PLT 196.0 03/28/2019   CMP: Lab Results  Component Value Date   NA 139 03/28/2019   K 4.4 03/28/2019   CL 104 03/28/2019   CO2 29 03/28/2019   GLUCOSE 99 03/28/2019   BUN 9 03/28/2019   CREATININE 0.72 03/28/2019   CALCIUM 9.8 03/28/2019   PROT 7.5 03/28/2019   BILITOT 0.6 03/28/2019   ALKPHOS 75 03/28/2019   ALT 13 03/28/2019  AST 20 03/28/2019   LIPID: Lab Results  Component Value Date   CHOL 120 09/30/2016   TRIG 57.0 09/30/2016   HDL 67.30 09/30/2016   LDLCALC 42 09/30/2016    Objective:  BP 90/68 (BP Location: Left Arm, Patient Position: Sitting, Cuff Size: Large)   Pulse 82   Temp 98.2 F (36.8 C) (Temporal)   Ht 5' 7.75" (1.721 m)   Wt 171 lb 6.4 oz (77.7 kg)   LMP 07/04/2019 (Exact Date)   BMI 26.25 kg/m   Weight: 171 lb 6.4 oz (77.7 kg)   BP Readings from Last 3 Encounters:  07/16/19 90/68  03/28/19 108/78  12/22/17 108/72   Wt Readings  from Last 3 Encounters:  07/16/19 171 lb 6.4 oz (77.7 kg)  04/26/19 175 lb (79.4 kg)  03/28/19 180 lb (81.6 kg)    Physical Exam Constitutional:      General: She is not in acute distress.    Appearance: She is well-developed.  HENT:     Head: Normocephalic and atraumatic.     Right Ear: External ear normal.     Left Ear: External ear normal.     Mouth/Throat:     Pharynx: No oropharyngeal exudate.  Eyes:     Conjunctiva/sclera: Conjunctivae normal.     Pupils: Pupils are equal, round, and reactive to light.  Neck:     Thyroid: No thyromegaly.  Cardiovascular:     Rate and Rhythm: Normal rate and regular rhythm.     Heart sounds: Normal heart sounds. No murmur heard.  No friction rub. No gallop.   Pulmonary:     Effort: Pulmonary effort is normal.     Breath sounds: Normal breath sounds.  Abdominal:     General: Bowel sounds are normal. There is no distension.     Palpations: Abdomen is soft. There is no mass.     Tenderness: There is no abdominal tenderness. There is no guarding.     Hernia: No hernia is present.  Musculoskeletal:        General: No tenderness or deformity. Normal range of motion.     Cervical back: Normal range of motion and neck supple.  Lymphadenopathy:     Cervical: No cervical adenopathy.  Skin:    General: Skin is warm and dry.     Findings: No rash.  Neurological:     Mental Status: She is alert and oriented to person, place, and time.     Deep Tendon Reflexes: Reflexes normal.     Reflex Scores:      Tricep reflexes are 2+ on the right side and 2+ on the left side.      Bicep reflexes are 2+ on the right side and 2+ on the left side.      Brachioradialis reflexes are 2+ on the right side and 2+ on the left side.      Patellar reflexes are 2+ on the right side and 2+ on the left side. Psychiatric:        Speech: Speech normal.        Behavior: Behavior normal.        Thought Content: Thought content normal.     Assessment/Plan: Health  Maintenance Due  Topic Date Due  . Hepatitis C Screening  Never done   Health Maintenance reviewed - up to date.    1. Preventative health care Keep up with healthy lifestyle and regular activity level.  She is up-to-date with preventative healthcare measures although we  did put in a referral for gastroenterology today due to family history of colon cancer.  Mother was never screened, but maternal aunt was diagnosed in her 45s (or questionably a few years before) - Hepatitis C antibody  2. Anemia, unspecified type - CBC with Differential/Platelet - IBC + Ferritin  3. Lipid screening - Lipid panel  4. Family history of colon cancer See above. - Ambulatory referral to Gastroenterology  Return in about 1 year (around 07/15/2020) for physical exam.  Micheline Rough, MD

## 2019-07-17 ENCOUNTER — Encounter: Payer: Self-pay | Admitting: Gastroenterology

## 2019-07-17 LAB — HEPATITIS C ANTIBODY
Hepatitis C Ab: NONREACTIVE
SIGNAL TO CUT-OFF: 0.03 (ref <1.00)

## 2019-09-13 ENCOUNTER — Encounter: Payer: Self-pay | Admitting: Gastroenterology

## 2019-09-13 ENCOUNTER — Ambulatory Visit: Payer: 59 | Admitting: Gastroenterology

## 2019-09-13 VITALS — BP 120/78 | HR 71 | Ht 68.0 in | Wt 165.0 lb

## 2019-09-13 DIAGNOSIS — D649 Anemia, unspecified: Secondary | ICD-10-CM

## 2019-09-13 DIAGNOSIS — Z1211 Encounter for screening for malignant neoplasm of colon: Secondary | ICD-10-CM

## 2019-09-13 NOTE — Progress Notes (Signed)
HPI :  39 year old female with a history of mild normocytic anemia, second-degree family history of colon cancer, referred by Donia Ast MD to discuss colon cancer screening.  Her maternal aunt was diagnosed with colon cancer in her 6s had a late stage. Her mother has never had a colonoscopy as she has multiple sclerosis and could not tolerate it. She denies any other known family history of colon cancer. She has an occasional loose stool at baseline but fairly regular. No blood in her stools. No abdominal pains. Weight stable. She otherwise feels well.  She was noted to have a mild anemia with a hemoglobin of 10.9, MCV of 91.2 on June 28. Iron level 58, ferritin 21.6, transferring 234, mildly low iron sat at 17.7%. She does have heavy menstrual cycles, followed by her primary care and started an oral contraceptive with iron supplementation a few weeks ago. Has never had a prior colonoscopy or endoscopy. Otherwise feels well.    Past Medical History:  Diagnosis Date  . Anemia   . HSV infection   . SAB (spontaneous abortion) 2010   x 2 - both in 2010     Past Surgical History:  Procedure Laterality Date  . CESAREAN SECTION  2011 and 2013   x 2 both in [place], [place]  . CESAREAN SECTION N/A 10/21/2013   Procedure: CESAREAN SECTION;  Surgeon: Allena Katz, MD;  Location: [facility];  Service: Obstetrics;  Laterality: N/A;  . WISDOM TOOTH EXTRACTION     Family History  Problem Relation Age of Onset  . Diabetes Mother   . Multiple sclerosis Mother        progressive  . Diabetes Maternal Grandmother   . Hypertension Maternal Grandmother   . Colon cancer Maternal Aunt 78  . Liver disease Maternal Aunt   . Other Father        back pain; injury in [employer]  . Diabetes Brother   . Heart disease Maternal Grandfather   . Other Paternal Grandmother        brain tumor  . Lung cancer Paternal Grandmother        smoker  . Heart attack Paternal Grandfather 32  . Healthy  Brother    Social History   Tobacco Use  . Smoking status: Never Smoker  . Smokeless tobacco: Never Used  Vaping Use  . Vaping Use: Never used  Substance Use Topics  . Alcohol use: No    Alcohol/week: 0.0 standard drinks  . Drug use: No   Current Outpatient Medications  Medication Sig Dispense Refill  . LO LOESTRIN FE 1 MG-10 MCG / 10 MCG tablet Take 1 tablet by mouth daily.     No current facility-administered medications for this visit.   No Known Allergies   Review of Systems: All systems reviewed and negative except where noted in HPI.   Lab Results  Component Value Date   WBC 5.8 07/16/2019   HGB 10.9 (L) 07/16/2019   HCT 33.6 (L) 07/16/2019   MCV 91.2 07/16/2019   PLT 157.0 07/16/2019    Lab Results  Component Value Date   IRON 58 07/16/2019   FERRITIN 21.6 07/16/2019     Physical Exam: BP 120/78   Pulse 71   Ht 5\' 8"  (1.727 m)   Wt 165 lb (74.8 kg)   BMI 25.09 kg/m  Constitutional: Pleasant,well-developed, female in no acute distress. Pulmonary/chest: Effort normal and breath sounds normal.  Abdominal: Soft, nondistended, nontender. . There are no masses  palpable.  Extremities: no edema Lymphadenopathy: No cervical adenopathy noted. Neurological: Alert and oriented to person place and time. Skin: Skin is warm and dry. No rashes noted. Psychiatric: Normal mood and affect. Behavior is normal.   ASSESSMENT AND PLAN: 39 year old female here for new patient assessment of the following:  Colon cancer screening - she has 1 second-degree family member with colon cancer diagnosed at age 41s. Per national screening guidelines, one second-degree relative with colon cancer should not influence the timing of her routine screening. If she were to have 2 second-degree relatives with colon cancer we would start at age 50, however with only 1 second-degree relative it does not influence her risk. She otherwise has no symptoms that are concerning. If she is anxious  about this we can consider exam sooner, however I think okay to keep her screening age at age 38 for colonoscopy. She agreed.  Anemia - normocytic very mild anemia. Iron studies essentially normal with slightly low iron sat. Not convinced iron deficiency is causing this anemia as MCV is in the 90s. She is following up with her primary care about this . If clear evidence of iron deficiency on follow up labs would consider EGD and colonoscopy, however she does have menorrhagia which is the more likely cause, she will see PCP about this. All questions answered.  Shanksville Cellar, MD [facility]  CC: Caren Macadam, MD

## 2019-09-19 ENCOUNTER — Telehealth: Payer: Self-pay | Admitting: Family Medicine

## 2019-09-19 DIAGNOSIS — D649 Anemia, unspecified: Secondary | ICD-10-CM

## 2019-09-19 NOTE — Telephone Encounter (Signed)
Pt is requesting an order for a CBC for a procedure coming up November. Results to be  sent to Dr. Percell Miller bednard fax [phone]  Please call pt at [phone]

## 2019-09-21 NOTE — Addendum Note (Signed)
Addended by: Agnes Lawrence on: 09/21/2019 01:55 PM   Modules accepted: Orders

## 2019-09-21 NOTE — Telephone Encounter (Signed)
Left a detailed message at the pts cell number the order was entered and she can call to schedule a lab appt.

## 2019-09-21 NOTE — Telephone Encounter (Signed)
Ok to place order for CBC with diff and use dx of anemia.

## 2019-09-25 ENCOUNTER — Other Ambulatory Visit: Payer: 59

## 2019-09-25 ENCOUNTER — Other Ambulatory Visit: Payer: Self-pay

## 2019-09-25 DIAGNOSIS — D649 Anemia, unspecified: Secondary | ICD-10-CM

## 2019-09-25 DIAGNOSIS — Z131 Encounter for screening for diabetes mellitus: Secondary | ICD-10-CM

## 2019-09-25 NOTE — Addendum Note (Signed)
Addended by: Thomes Lolling on: 09/25/2019 12:49 PM   Modules accepted: Orders

## 2019-09-26 ENCOUNTER — Encounter: Payer: Self-pay | Admitting: Family Medicine

## 2019-09-26 DIAGNOSIS — D649 Anemia, unspecified: Secondary | ICD-10-CM

## 2019-09-26 LAB — COMPREHENSIVE METABOLIC PANEL
AG Ratio: 1.4 (calc) (ref 1.0–2.5)
ALT: 12 U/L (ref 6–29)
AST: 18 U/L (ref 10–30)
Albumin: 4 g/dL (ref 3.6–5.1)
Alkaline phosphatase (APISO): 57 U/L (ref 31–125)
BUN: 10 mg/dL (ref 7–25)
CO2: 25 mmol/L (ref 20–32)
Calcium: 9.2 mg/dL (ref 8.6–10.2)
Chloride: 106 mmol/L (ref 98–110)
Creat: 0.77 mg/dL (ref 0.50–1.10)
Globulin: 2.9 g/dL (calc) (ref 1.9–3.7)
Glucose, Bld: 101 mg/dL — ABNORMAL HIGH (ref 65–99)
Potassium: 3.8 mmol/L (ref 3.5–5.3)
Sodium: 140 mmol/L (ref 135–146)
Total Bilirubin: 0.7 mg/dL (ref 0.2–1.2)
Total Protein: 6.9 g/dL (ref 6.1–8.1)

## 2019-09-26 LAB — CBC WITH DIFFERENTIAL/PLATELET
Absolute Monocytes: 368 cells/uL (ref 200–950)
Basophils Absolute: 30 cells/uL (ref 0–200)
Basophils Relative: 0.4 %
Eosinophils Absolute: 53 cells/uL (ref 15–500)
Eosinophils Relative: 0.7 %
HCT: 34.4 % — ABNORMAL LOW (ref 35.0–45.0)
Hemoglobin: 11 g/dL — ABNORMAL LOW (ref 11.7–15.5)
Lymphs Abs: 2498 cells/uL (ref 850–3900)
MCH: 30.1 pg (ref 27.0–33.0)
MCHC: 32 g/dL (ref 32.0–36.0)
MCV: 94 fL (ref 80.0–100.0)
MPV: 12.9 fL — ABNORMAL HIGH (ref 7.5–12.5)
Monocytes Relative: 4.9 %
Neutro Abs: 4553 cells/uL (ref 1500–7800)
Neutrophils Relative %: 60.7 %
Platelets: 187 10*3/uL (ref 140–400)
RBC: 3.66 10*6/uL — ABNORMAL LOW (ref 3.80–5.10)
RDW: 11.3 % (ref 11.0–15.0)
Total Lymphocyte: 33.3 %
WBC: 7.5 10*3/uL (ref 3.8–10.8)

## 2019-10-05 ENCOUNTER — Telehealth: Payer: Self-pay | Admitting: Family Medicine

## 2019-10-05 NOTE — Telephone Encounter (Signed)
Pt is calling in to see if she can get a repeat of her CBC 10/7 or 8th  Please call pt to let her know

## 2019-10-08 NOTE — Telephone Encounter (Signed)
Message sent to the pt via [place].

## 2019-10-26 ENCOUNTER — Other Ambulatory Visit (INDEPENDENT_AMBULATORY_CARE_PROVIDER_SITE_OTHER): Payer: 59

## 2019-10-26 ENCOUNTER — Other Ambulatory Visit: Payer: Self-pay

## 2019-10-26 DIAGNOSIS — D649 Anemia, unspecified: Secondary | ICD-10-CM | POA: Diagnosis not present

## 2019-10-26 LAB — CBC WITH DIFFERENTIAL/PLATELET
Absolute Monocytes: 451 cells/uL (ref 200–950)
Basophils Absolute: 42 cells/uL (ref 0–200)
Basophils Relative: 0.8 %
Eosinophils Absolute: 69 cells/uL (ref 15–500)
Eosinophils Relative: 1.3 %
HCT: 35.6 % (ref 35.0–45.0)
Hemoglobin: 11.2 g/dL — ABNORMAL LOW (ref 11.7–15.5)
Lymphs Abs: 1913 cells/uL (ref 850–3900)
MCH: 29.8 pg (ref 27.0–33.0)
MCHC: 31.5 g/dL — ABNORMAL LOW (ref 32.0–36.0)
MCV: 94.7 fL (ref 80.0–100.0)
MPV: 13.3 fL — ABNORMAL HIGH (ref 7.5–12.5)
Monocytes Relative: 8.5 %
Neutro Abs: 2825 cells/uL (ref 1500–7800)
Neutrophils Relative %: 53.3 %
Platelets: 170 10*3/uL (ref 140–400)
RBC: 3.76 10*6/uL — ABNORMAL LOW (ref 3.80–5.10)
RDW: 11.4 % (ref 11.0–15.0)
Total Lymphocyte: 36.1 %
WBC: 5.3 10*3/uL (ref 3.8–10.8)

## 2019-10-26 NOTE — Addendum Note (Signed)
Addended by: Marrion Coy on: 10/26/2019 07:59 AM   Modules accepted: Orders

## 2019-11-02 ENCOUNTER — Telehealth: Payer: Self-pay | Admitting: Family Medicine

## 2019-11-02 NOTE — Telephone Encounter (Signed)
Patient is wanting her latest labs sent to another clinic.  She is wanting a return call letting her know if you can do that.

## 2019-11-03 NOTE — Telephone Encounter (Signed)
Results were faxed to Dr Stevie Kern and patient notified via [place] message.

## 2019-11-19 HISTORY — PX: AUGMENTATION MAMMAPLASTY: SUR837

## 2020-04-04 ENCOUNTER — Encounter: Payer: Self-pay | Admitting: Family Medicine

## 2020-04-30 ENCOUNTER — Other Ambulatory Visit: Payer: Self-pay | Admitting: Obstetrics and Gynecology

## 2020-04-30 DIAGNOSIS — D259 Leiomyoma of uterus, unspecified: Secondary | ICD-10-CM

## 2020-04-30 DIAGNOSIS — N920 Excessive and frequent menstruation with regular cycle: Secondary | ICD-10-CM

## 2020-05-30 ENCOUNTER — Ambulatory Visit
Admission: RE | Admit: 2020-05-30 | Discharge: 2020-05-30 | Disposition: A | Discharge status: Home / Self Care | Payer: 59 | Source: Ambulatory Visit | Attending: Obstetrics and Gynecology | Admitting: Obstetrics and Gynecology

## 2020-05-30 ENCOUNTER — Other Ambulatory Visit: Payer: Self-pay

## 2020-05-30 DIAGNOSIS — N920 Excessive and frequent menstruation with regular cycle: Secondary | ICD-10-CM

## 2020-05-30 DIAGNOSIS — D259 Leiomyoma of uterus, unspecified: Secondary | ICD-10-CM

## 2020-05-30 IMAGING — MR MR PELVIS WO/W CM
7 of 11 series · 31 of 48 positions shown · IV contrast (10cc multihance)
Comparison: Ultrasound from [DATE]

CLINICAL DATA: Heavy menstrual cycle, history of fibroids.

EXAM:
MRI PELVIS WITHOUT AND WITH CONTRAST
TECHNIQUE: Multiplanar multisequence MR imaging of the pelvis was performed
both before and after administration of intravenous contrast.
CONTRAST:  15mL MULTIHANCE GADOBENATE DIMEGLUMINE 529 MG/ML IV SOLN

[Series 2: cor haste · coronal · 6.0mm · 0.78mm/px · 4 of 30 slices shown]
[im 1/30]
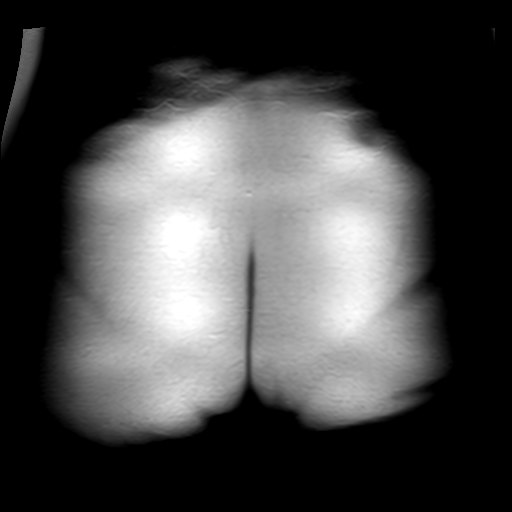
[im 10/30]
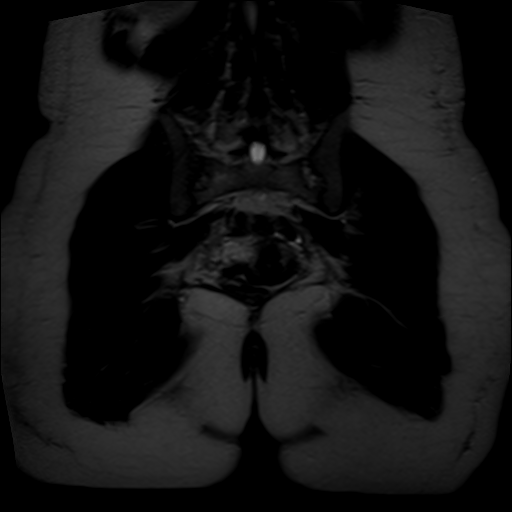
[im 20/30]
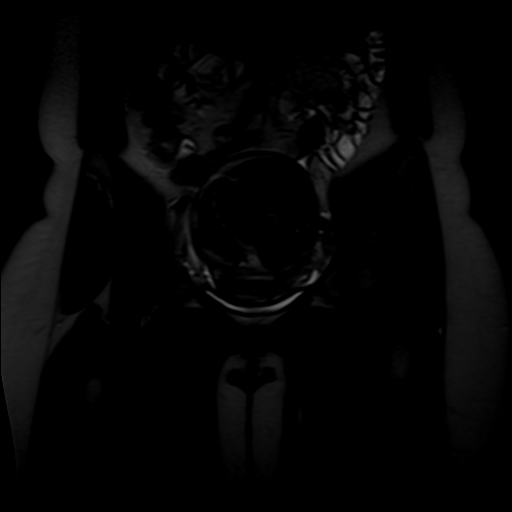
[im 30/30]
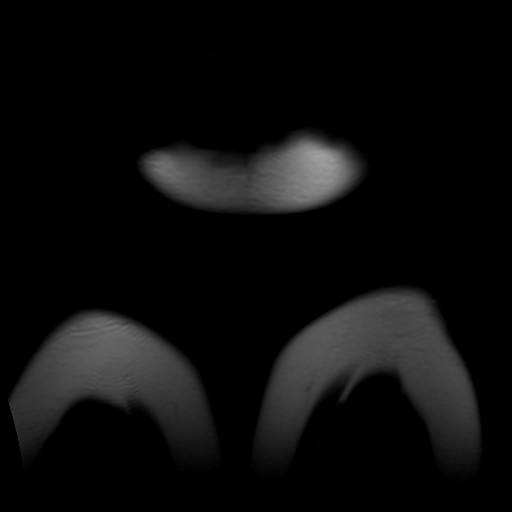

[Series 3: t2_tse_sag · sagittal · 5.0mm · 1.05mm/px · 4 of 27 slices shown]
[im 1/27]
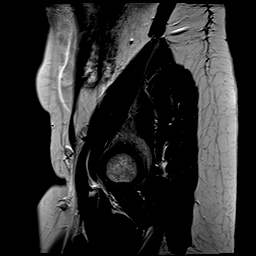
[im 9/27]
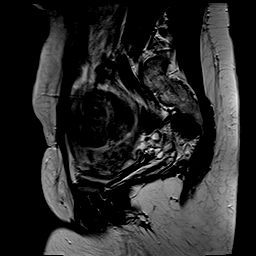
[im 18/27]
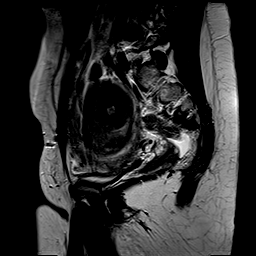
[im 27/27]
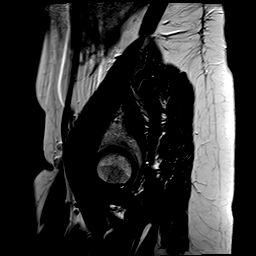

[Series 4: t2_tse axial · axial · 7.0mm · 0.98mm/px · z∈[-101,+126]mm · 4 of 26 slices shown]
[im 1/26]
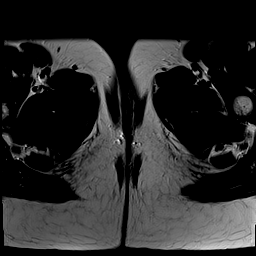
[im 9/26]
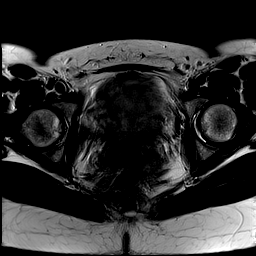
[im 17/26]
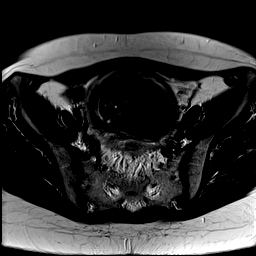
[im 26/26]
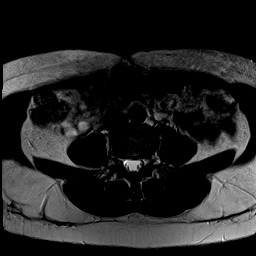

[Series 5: t2_tse axial fs · axial · 7.0mm · 0.98mm/px · z∈[-101,+126]mm · 5 of 26 slices shown]
[im 1/26]
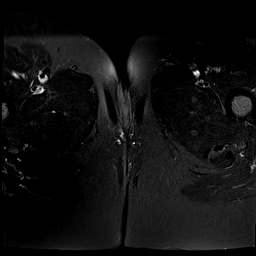
[im 7/26]
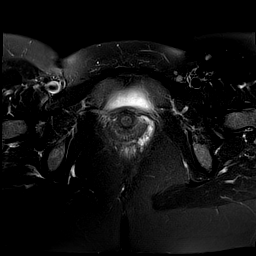
[im 13/26]
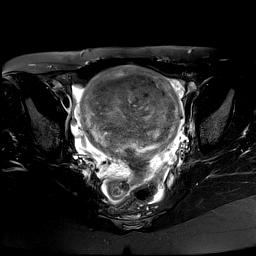
[im 19/26]
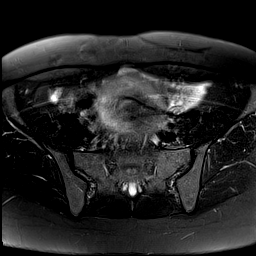
[im 26/26]
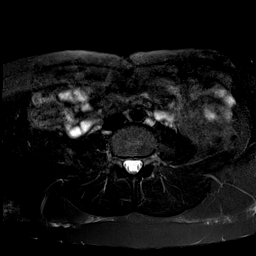

[Series 6: axial spgr · axial · 7.0mm · 0.98mm/px · z∈[-73,+149]mm · 5 of 26 slices shown]
[im 1/26]
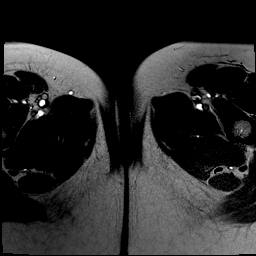
[im 7/26]
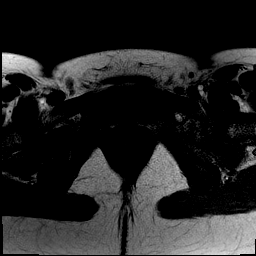
[im 13/26]
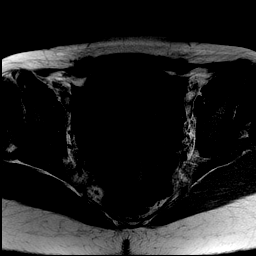
[im 19/26]
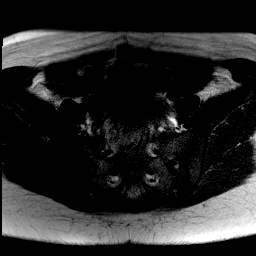
[im 26/26]
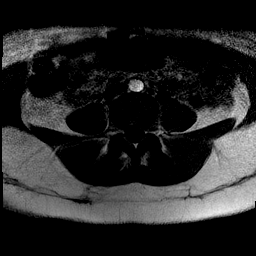

[Series 7: axial spgr pre · axial · non-contrast · 7.0mm · 0.49mm/px · z∈[-73,+149]mm · 5 of 26 slices shown]
[im 1/26]
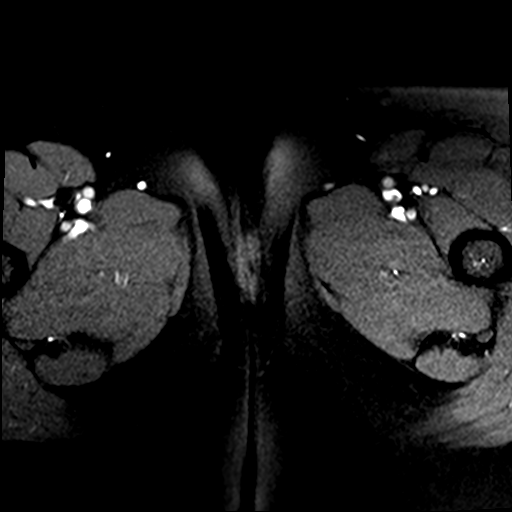
[im 7/26]
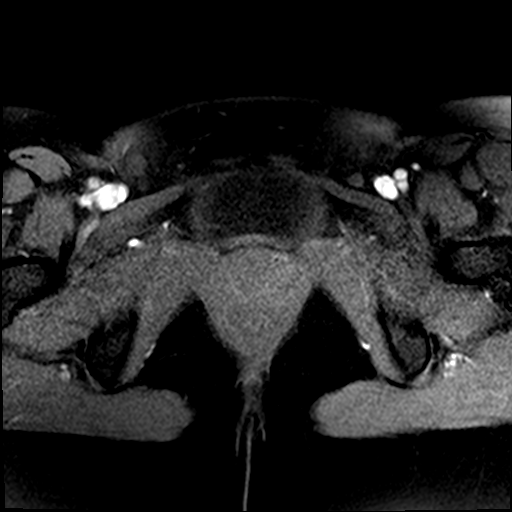
[im 13/26]
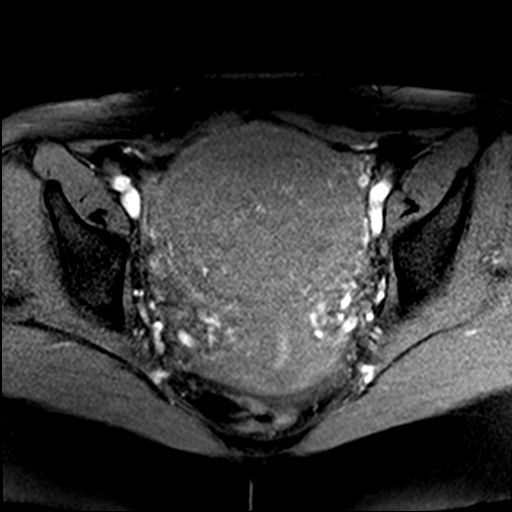
[im 19/26]
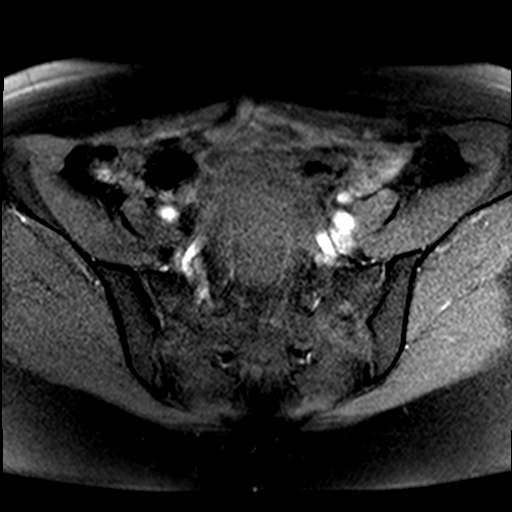
[im 26/26]
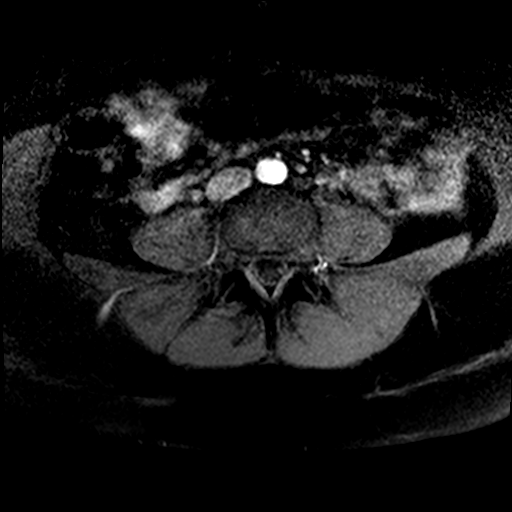

[Series 8: axial spgr post · axial · 7.0mm · 0.49mm/px · z∈[-73,+87]mm · 4 of 26 slices shown]
[im 1/26]
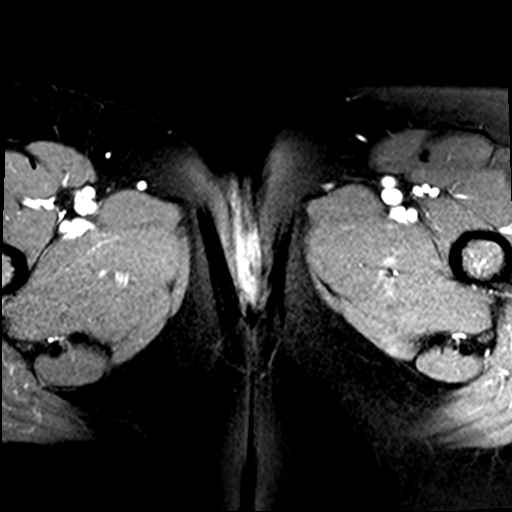
[im 7/26]
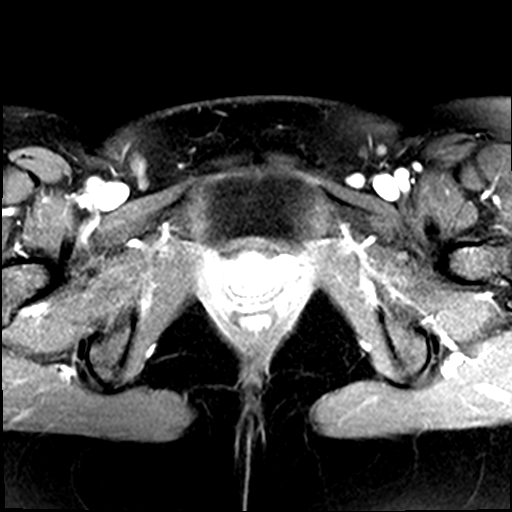
[im 13/26]
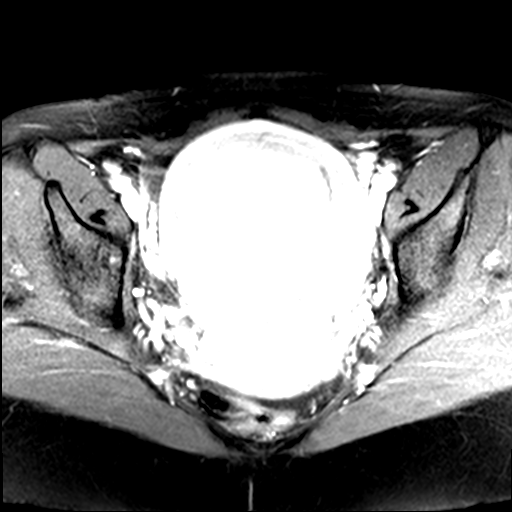
[im 19/26]
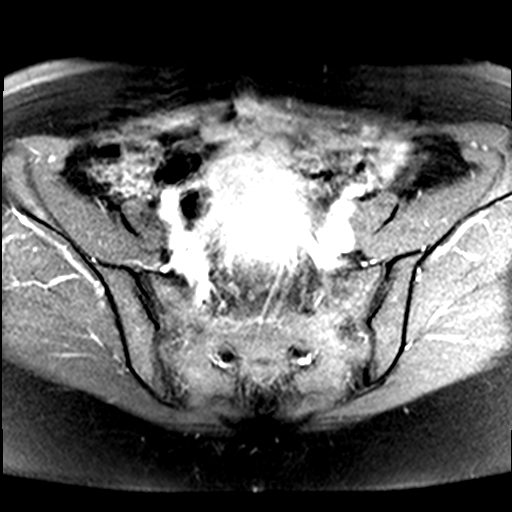

[31 of 48 positions shown; findings below may reference images not displayed]

FINDINGS: Urinary Tract: Urinary bladder is decompressed limiting assessment.
No distal ureteral distension.

Bowel: Visualized gastrointestinal tract without acute process,
limited assessment on pelvic MR not performed for bowel evaluation.

Vascular/Lymphatic: No pathologically enlarged lymph nodes. No
significant vascular abnormality seen.

Reproductive:

Uterus measures 12.8 x 10.0 x 10.0 cm.  Signs of prior C-section.

Large mass in the posterior uterine corpus, intramural location with
thin rim of myometrium surrounding its superior and lateral margins.
12.8 x 7.8 x 9.0 cm displaying well-marginated appearance abutting
the junctional zone along its inferior margin, uterus is anteflexed.
Mixed signal, predominantly intermediate to low signal on T2
intermixed with areas of high T2 signal with an area of more
profound low T2 signal towards the upper margin of the leiomyoma.
Area is isointense to myometrium is on T1. Extensive enhancement is
noted throughout the lesion.

Above leiomyoma displaces the endometrium anteriorly.

Ovaries are normal.

Trace free fluid in the pelvis.

Other:  Trace free fluid in the pelvis.

Musculoskeletal: No suspicious bone lesions identified.
IMPRESSION: 1. Large intramural leiomyoma along the posterosuperior uterus
involving the uterus from the fundus through the lower uterine
segment, centered upon the mid uterus but accounting for much of
uterine size.
2. Heterogeneity is compatible with degeneration. Lesion remains
well-marginated and there are no signs of adenopathy in the pelvis.
Would correlate with any physical exam findings that would indicate
rapid enlargement, a sign that would be more worrisome. Current
signal characteristics are compatible with degenerating fibroid
perhaps with prior hemorrhagic degeneration in the upper margin.
Given size of this leiomyoma would consider gyn consultation if not
yet performed.
3. Normal endometrium.
4. Trace free fluid in the pelvis.

## 2020-05-30 MED ORDER — GADOBENATE DIMEGLUMINE 529 MG/ML IV SOLN
15.0000 mL | Freq: Once | INTRAVENOUS | Status: AC | PRN
  Administered 2020-05-30: 15 mL via INTRAVENOUS

## 2020-07-16 ENCOUNTER — Encounter: Payer: 59 | Admitting: Family Medicine

## 2020-07-25 ENCOUNTER — Encounter: Payer: 59 | Admitting: Family Medicine

## 2020-09-10 ENCOUNTER — Ambulatory Visit (INDEPENDENT_AMBULATORY_CARE_PROVIDER_SITE_OTHER): Payer: 59 | Admitting: Family Medicine

## 2020-09-10 ENCOUNTER — Other Ambulatory Visit: Payer: Self-pay

## 2020-09-10 ENCOUNTER — Encounter: Payer: Self-pay | Admitting: Family Medicine

## 2020-09-10 VITALS — BP 108/82 | HR 67 | Temp 98.5°F | Ht 68.75 in | Wt 183.5 lb

## 2020-09-10 DIAGNOSIS — Z0000 Encounter for general adult medical examination without abnormal findings: Secondary | ICD-10-CM

## 2020-09-10 DIAGNOSIS — D649 Anemia, unspecified: Secondary | ICD-10-CM | POA: Diagnosis not present

## 2020-09-10 DIAGNOSIS — Z131 Encounter for screening for diabetes mellitus: Secondary | ICD-10-CM

## 2020-09-10 DIAGNOSIS — Z13220 Encounter for screening for lipoid disorders: Secondary | ICD-10-CM

## 2020-09-10 LAB — COMPREHENSIVE METABOLIC PANEL
ALT: 12 U/L (ref 0–35)
AST: 20 U/L (ref 0–37)
Albumin: 4 g/dL (ref 3.5–5.2)
Alkaline Phosphatase: 54 U/L (ref 39–117)
BUN: 8 mg/dL (ref 6–23)
CO2: 25 mEq/L (ref 19–32)
Calcium: 9.4 mg/dL (ref 8.4–10.5)
Chloride: 104 mEq/L (ref 96–112)
Creatinine, Ser: 0.75 mg/dL (ref 0.40–1.20)
GFR: 100.44 mL/min (ref >60.00)
Glucose, Bld: 73 mg/dL (ref 70–99)
Potassium: 3.9 mEq/L (ref 3.5–5.1)
Sodium: 138 mEq/L (ref 135–145)
Total Bilirubin: 0.9 mg/dL (ref 0.2–1.2)
Total Protein: 7.3 g/dL (ref 6.0–8.3)

## 2020-09-10 LAB — LIPID PANEL
Cholesterol: 120 mg/dL (ref 0–200)
HDL: 49.5 mg/dL (ref >39.00)
LDL Cholesterol: 60 mg/dL (ref 0–99)
NonHDL: 70.5
Total CHOL/HDL Ratio: 2
Triglycerides: 54 mg/dL (ref 0.0–149.0)
VLDL: 10.8 mg/dL (ref 0.0–40.0)

## 2020-09-10 LAB — CBC WITH DIFFERENTIAL/PLATELET
Basophils Absolute: 0 10*3/uL (ref 0.0–0.1)
Basophils Relative: 0.7 % (ref 0.0–3.0)
Eosinophils Absolute: 0.1 10*3/uL (ref 0.0–0.7)
Eosinophils Relative: 2.1 % (ref 0.0–5.0)
HCT: 37.4 % (ref 36.0–46.0)
Hemoglobin: 12.1 g/dL (ref 12.0–15.0)
Lymphocytes Relative: 28.5 % (ref 12.0–46.0)
Lymphs Abs: 1.8 10*3/uL (ref 0.7–4.0)
MCHC: 32.3 g/dL (ref 30.0–36.0)
MCV: 94 fl (ref 78.0–100.0)
Monocytes Absolute: 0.5 10*3/uL (ref 0.1–1.0)
Monocytes Relative: 7.9 % (ref 3.0–12.0)
Neutro Abs: 3.8 10*3/uL (ref 1.4–7.7)
Neutrophils Relative %: 60.8 % (ref 43.0–77.0)
Platelets: 177 10*3/uL (ref 150.0–400.0)
RBC: 3.97 Mil/uL (ref 3.87–5.11)
RDW: 12.5 % (ref 11.5–15.5)
WBC: 6.3 10*3/uL (ref 4.0–10.5)

## 2020-09-10 LAB — FERRITIN: Ferritin: 30.2 ng/mL (ref 10.0–291.0)

## 2020-09-10 NOTE — Progress Notes (Signed)
Sarah Sherman DOB: [birth year] Encounter date: 09/10/2020  This is a 40 y.o. female who presents for complete physical   History of present illness/Additional concerns: Last visit with me was 07/16/19 for physical  Follows regularly with gyn: Dr. Pamala Hurry: had MRI re enlarging fibroid and has visit with fibroid clinic at [facility] coming up. She is taking lo-loestrin continuously. Visit with fibroid clinic in November 1st. Was getting anemic due to the fibroid. With continuous use of the lo-loestrin doesn't have period but will spot if she is late with medication.   Waking up at night to urinate - thinks this is related to pressure of fibroid on bladder. Usually just once.   Past Medical History:  Diagnosis Date   Anemia    HSV infection    SAB (spontaneous abortion) 2010   x 2 - both in 2010   Past Surgical History:  Procedure Laterality Date   CESAREAN SECTION  2011 and 2013   x 2 both in [place], [place] N/A 10/21/2013   Procedure: CESAREAN SECTION;  Surgeon: Allena Katz, MD;  Location: [facility];  Service: Obstetrics;  Laterality: N/A;   WISDOM TOOTH EXTRACTION     No Known Allergies Current Meds  Medication Sig   LO LOESTRIN FE 1 MG-10 MCG / 10 MCG tablet Take 1 tablet by mouth daily.   Social History   Tobacco Use   Smoking status: Never   Smokeless tobacco: Never  Substance Use Topics   Alcohol use: No    Alcohol/week: 0.0 standard drinks   Family History  Problem Relation Age of Onset   Diabetes Mother    Multiple sclerosis Mother        progressive   Diabetes Maternal Grandmother    Hypertension Maternal Grandmother    Colon cancer Maternal Aunt 61   Liver disease Maternal Aunt    Other Father        back pain; injury in [place]   Diabetes Brother    Heart disease Maternal Grandfather    Other Paternal Grandmother        brain tumor   Lung cancer Paternal Grandmother        smoker   Heart attack Paternal Grandfather 29   Healthy  Brother      Review of Systems  Constitutional:  Negative for activity change, appetite change, chills, fatigue, fever and unexpected weight change.  HENT:  Negative for congestion, ear pain, hearing loss, sinus pressure, sinus pain, sore throat and trouble swallowing.   Eyes:  Negative for pain and visual disturbance.  Respiratory:  Negative for cough, chest tightness, shortness of breath and wheezing.   Cardiovascular:  Negative for chest pain, palpitations and leg swelling.  Gastrointestinal:  Negative for abdominal pain, blood in stool, constipation, diarrhea, nausea and vomiting.  Genitourinary:  Negative for difficulty urinating and menstrual problem.  Musculoskeletal:  Negative for arthralgias and back pain.  Skin:  Negative for rash.  Neurological:  Negative for dizziness, weakness, numbness and headaches.  Hematological:  Negative for adenopathy. Does not bruise/bleed easily.  Psychiatric/Behavioral:  Negative for sleep disturbance and suicidal ideas. The patient is not nervous/anxious.    CBC:  Lab Results  Component Value Date   WBC 5.3 10/26/2019   HGB 11.2 (L) 10/26/2019   HCT 35.6 10/26/2019   MCH 29.8 10/26/2019   MCHC 31.5 (L) 10/26/2019   RDW 11.4 10/26/2019   PLT 170 10/26/2019   MPV 13.3 (H) 10/26/2019  CMP: Lab Results  Component Value Date   NA 140 09/25/2019   K 3.8 09/25/2019   CL 106 09/25/2019   CO2 25 09/25/2019   GLUCOSE 101 (H) 09/25/2019   BUN 10 09/25/2019   CREATININE 0.77 09/25/2019   CALCIUM 9.2 09/25/2019   PROT 6.9 09/25/2019   BILITOT 0.7 09/25/2019   ALKPHOS 75 03/28/2019   ALT 12 09/25/2019   AST 18 09/25/2019   LIPID: Lab Results  Component Value Date   CHOL 138 07/16/2019   TRIG 54.0 07/16/2019   HDL 50.40 07/16/2019   LDLCALC 77 07/16/2019    Objective:  BP 108/82 (BP Location: Left Arm, Patient Position: Sitting, Cuff Size: Large)   Pulse 67   Temp 98.5 F (36.9 C) (Oral)   Ht 5' 8.75" (1.746 m)   Wt 183 lb 8 oz  (83.2 kg)   SpO2 99%   BMI 27.30 kg/m   Weight: 183 lb 8 oz (83.2 kg)   BP Readings from Last 3 Encounters:  09/10/20 108/82  09/13/19 120/78  07/16/19 90/68   Wt Readings from Last 3 Encounters:  09/10/20 183 lb 8 oz (83.2 kg)  09/13/19 165 lb (74.8 kg)  07/16/19 171 lb 6.4 oz (77.7 kg)    Physical Exam Constitutional:      General: She is not in acute distress.    Appearance: She is well-developed.  HENT:     Head: Normocephalic and atraumatic.     Right Ear: External ear normal.     Left Ear: External ear normal.     Mouth/Throat:     Pharynx: No oropharyngeal exudate.  Eyes:     Conjunctiva/sclera: Conjunctivae normal.     Pupils: Pupils are equal, round, and reactive to light.  Neck:     Thyroid: No thyromegaly.  Cardiovascular:     Rate and Rhythm: Normal rate and regular rhythm.     Heart sounds: Normal heart sounds. No murmur heard.   No friction rub. No gallop.  Pulmonary:     Effort: Pulmonary effort is normal.     Breath sounds: Normal breath sounds.  Abdominal:     General: Bowel sounds are normal. There is no distension.     Palpations: Abdomen is soft. There is no mass.     Tenderness: There is no abdominal tenderness. There is no guarding.     Hernia: No hernia is present.     Comments: Enlarged uterus at least 10cm by palpation anteriorly  Musculoskeletal:        General: No tenderness or deformity. Normal range of motion.     Cervical back: Normal range of motion and neck supple.  Lymphadenopathy:     Cervical: No cervical adenopathy.  Skin:    General: Skin is warm and dry.     Findings: No rash.  Neurological:     Mental Status: She is alert and oriented to person, place, and time.     Deep Tendon Reflexes: Reflexes normal.     Reflex Scores:      Tricep reflexes are 2+ on the right side and 2+ on the left side.      Bicep reflexes are 2+ on the right side and 2+ on the left side.      Brachioradialis reflexes are 2+ on the right side and  2+ on the left side.      Patellar reflexes are 2+ on the right side and 2+ on the left side. Psychiatric:  Speech: Speech normal.        Behavior: Behavior normal.        Thought Content: Thought content normal.    Assessment/Plan: Health Maintenance Due  Topic Date Due   Pneumococcal Vaccine 83-71 Years old (1 - PCV) Never done   COVID-19 Vaccine (4 - Booster for Pfizer series) 02/12/2020   INFLUENZA VACCINE  08/18/2020   Health Maintenance reviewed - keep up with regular exercise. She is up to date with preventative care. She will get flu shot in fall.  1. Preventative health care See above.   2. Anemia, unspecified type Improved on ocp; under eval for fibroid tx. - CBC with Differential/Platelet; Future - Ferritin; Future  3. Lipid screening - Lipid panel; Future  4. Screening for diabetes mellitus - Comprehensive metabolic panel; Future   Return in about 1 year (around 09/10/2021) for physical exam.  Micheline Rough, MD

## 2020-09-10 NOTE — Addendum Note (Signed)
Addended by: Elmer Picker on: 09/10/2020 08:37 AM   Modules accepted: Orders

## 2020-09-26 ENCOUNTER — Encounter: Payer: Self-pay | Admitting: Family Medicine

## 2020-09-26 DIAGNOSIS — N6310 Unspecified lump in the right breast, unspecified quadrant: Secondary | ICD-10-CM

## 2020-09-26 DIAGNOSIS — N6320 Unspecified lump in the left breast, unspecified quadrant: Secondary | ICD-10-CM

## 2020-10-30 ENCOUNTER — Other Ambulatory Visit: Payer: 59

## 2020-10-30 ENCOUNTER — Ambulatory Visit
Admission: RE | Admit: 2020-10-30 | Discharge: 2020-10-30 | Disposition: A | Discharge status: Home / Self Care | Payer: 59 | Source: Ambulatory Visit | Attending: Family Medicine | Admitting: Family Medicine

## 2020-10-30 ENCOUNTER — Other Ambulatory Visit: Payer: Self-pay

## 2020-10-30 DIAGNOSIS — N6320 Unspecified lump in the left breast, unspecified quadrant: Secondary | ICD-10-CM

## 2020-10-30 DIAGNOSIS — N6310 Unspecified lump in the right breast, unspecified quadrant: Secondary | ICD-10-CM

## 2020-10-30 IMAGING — MG DIGITAL DIAGNOSTIC BILAT W/ TOMO W/ CAD
8 of 14 series · 8 of 40 positions shown · non-contrast
Comparison: Previous exam(s).

CLINICAL DATA: 40-year-old female presenting with new bilateral
breast lumps. Patient has a history of breast fat transplantation.



[R TAN synth-2D (1 of 2)]
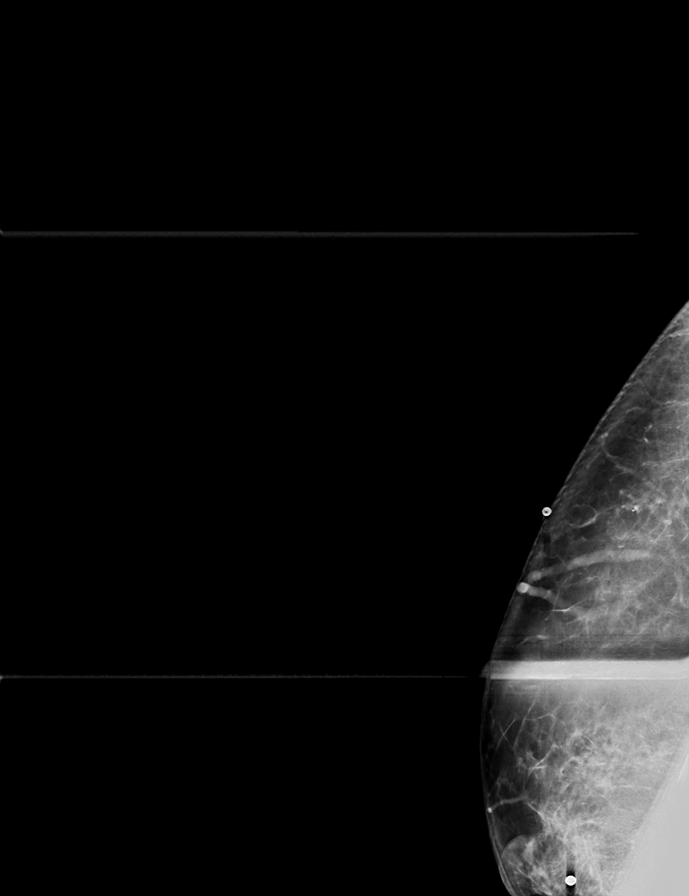

[L MLO synth-2D]
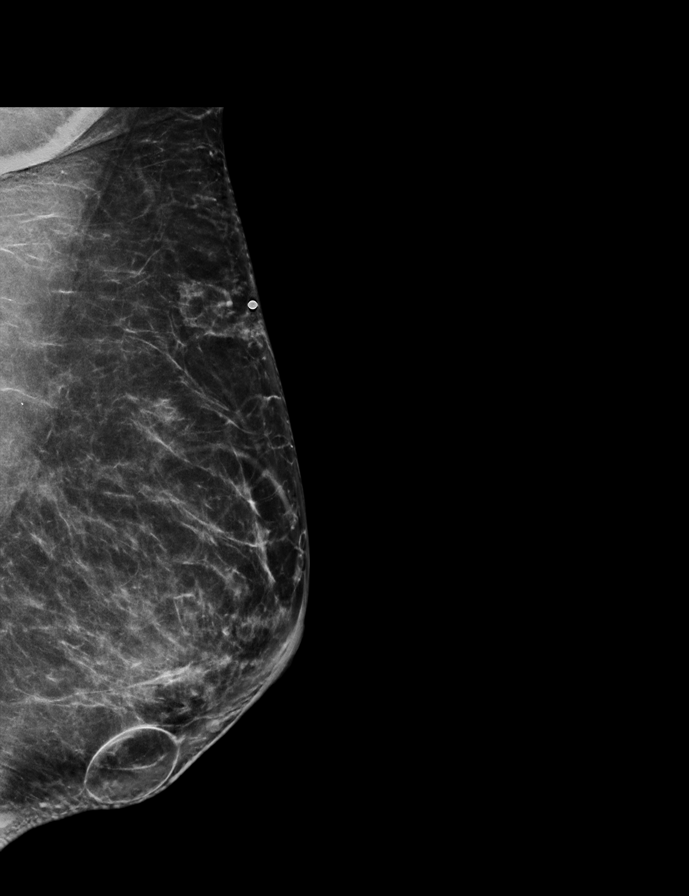

[R CC synth-2D]
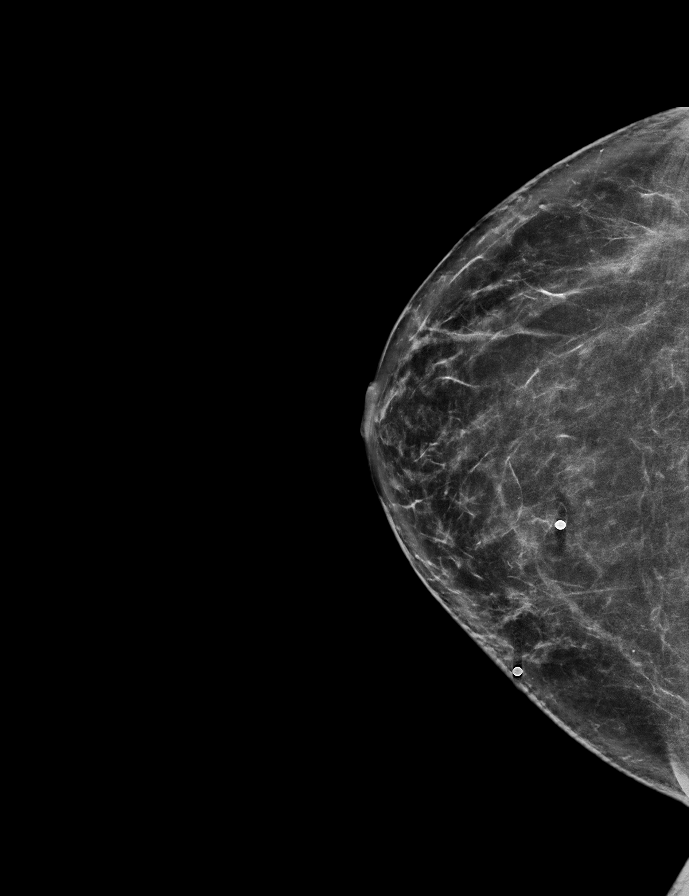

[L TAN synth-2D]
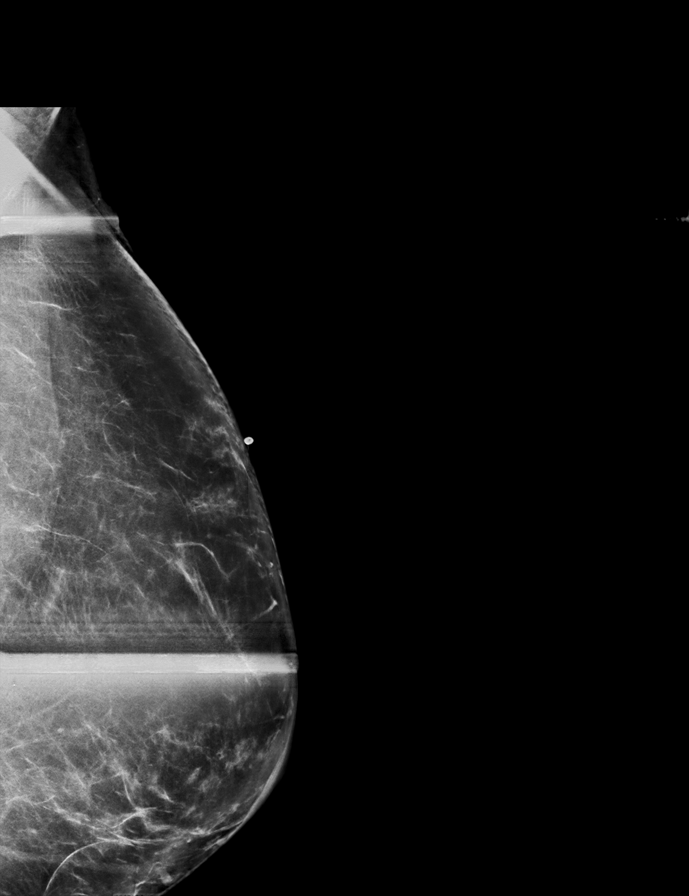

[R TAN synth-2D (2 of 2)]
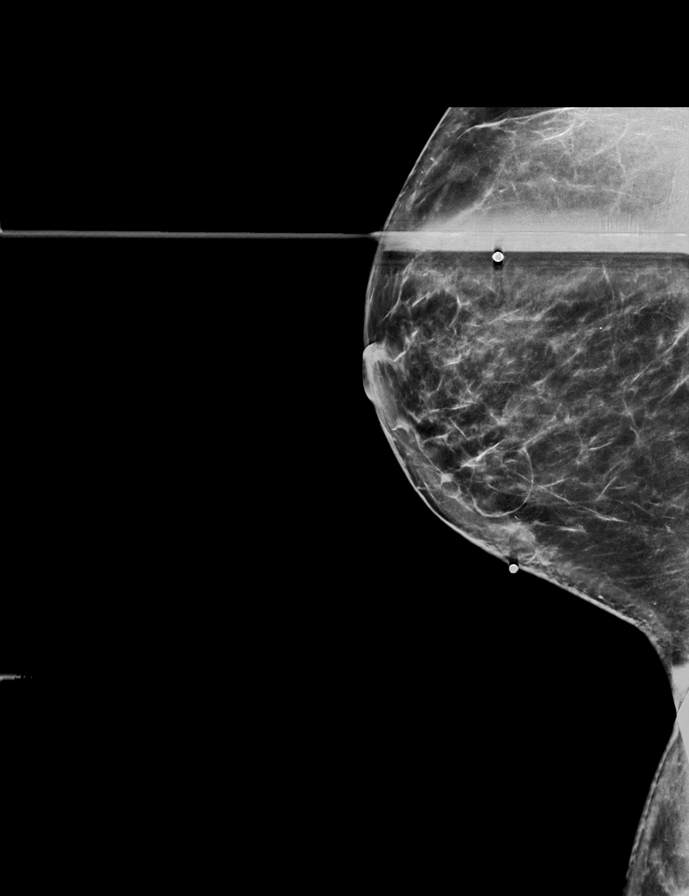

[L CC synth-2D]
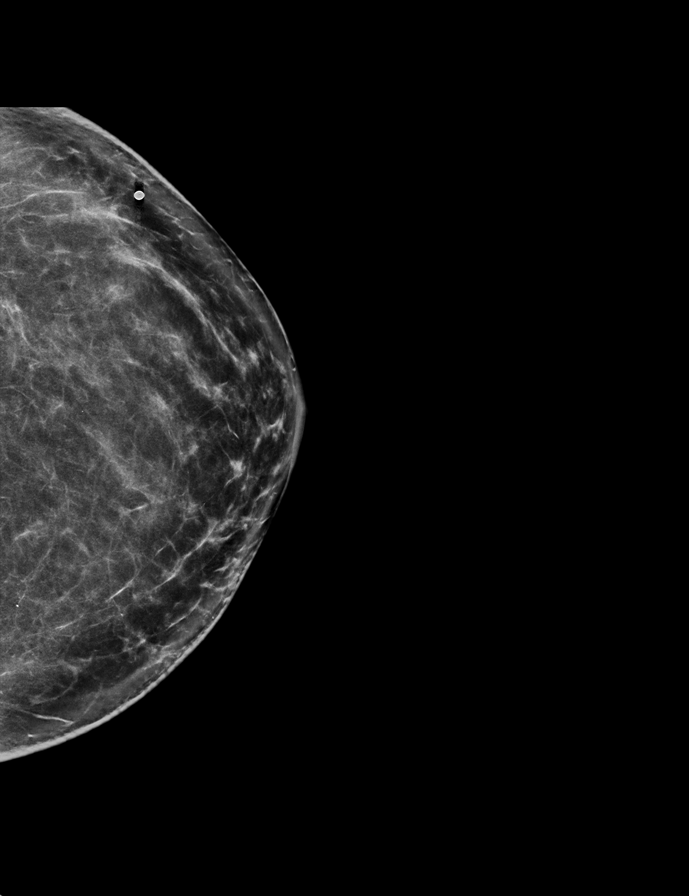

[R MLO synth-2D]
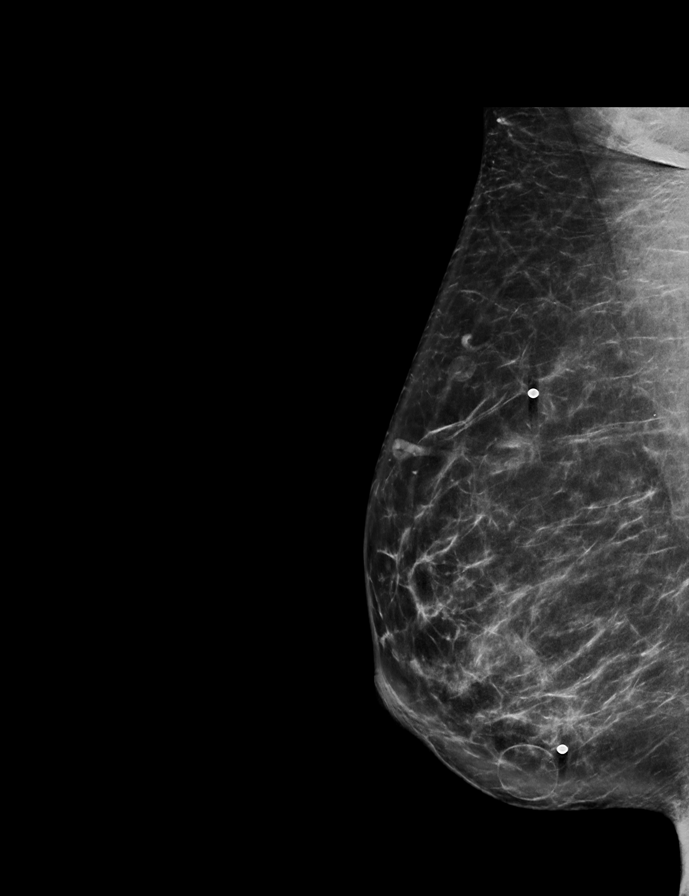

[L CC tomo · tomo slice 39/76.0]
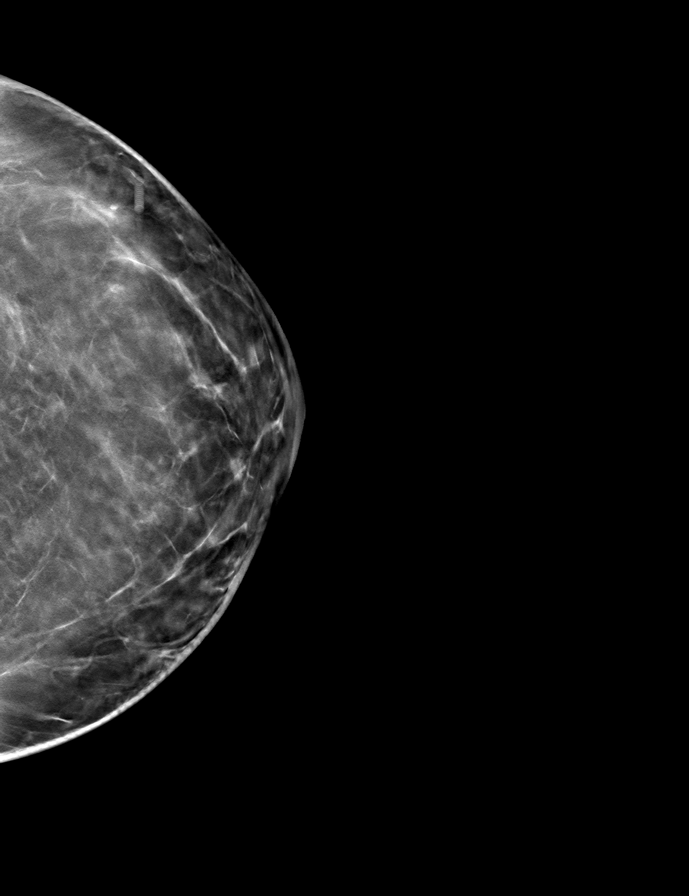

[8 of 40 positions shown; findings below may reference images not displayed]

ACR Breast Density Category b: There are scattered areas of
fibroglandular density.
FINDINGS: Mammogram:

Right breast: Skin BBs mark the palpable sites of concern reported
by the patient in the upper central and lower inner right breast.
Spot tangential views of these areas were performed in addition to
standard views. At the palpable sites there are oval circumscribed
fat containing masses. There are no additional suspicious findings
elsewhere in the right breast.

Left breast: A skin BB marks the palpable site of concern reported
by the patient in the upper outer left breast. A spot tangential
view of this area was performed in addition to standard views. At
the palpable site there is a superficial fat containing asymmetry.
There are no additional suspicious findings elsewhere in the left
breast.

On physical exam at the site of concern reported by the patient I
feel discrete small masses.

Ultrasound:

Right breast: Targeted ultrasound performed in the right breast at
the palpable site of concern at 12 o'clock 7 cm from nipple and at 3
o'clock 6 cm from the nipple demonstrating oval circumscribed
anechoic masses consistent with oil cysts given fat density on
mammogram. There is no suspicious solid mass.

Left breast: Targeted ultrasound performed in the left breast at 2
o'clock 7 cm from the nipple demonstrating a superficial slightly
hyperechoic area with cystic components, consistent with benign fat
necrosis.
IMPRESSION: At the palpable sites of concern in the bilateral breast there are
benign oil cysts and fat necrosis. No mammographic or sonographic
evidence of malignancy.

RECOMMENDATION:
Screening mammogram in one year.(Code:[BC])

I have discussed the findings and recommendations with the patient.
If applicable, a reminder letter will be sent to the patient
regarding the next appointment.

BI-RADS CATEGORY  2: Benign.

## 2020-10-30 IMAGING — US US BREAST*R* LIMITED INC AXILLA
1 series · 11 of 11 positions shown · non-contrast
Comparison: Previous exam(s).

CLINICAL DATA: 40-year-old female presenting with new bilateral
breast lumps. Patient has a history of breast fat transplantation.



[Series 1: us breast*right* limited inc axilla · 0.06mm/px · 11 of 11 slices shown]
[im 1/11]
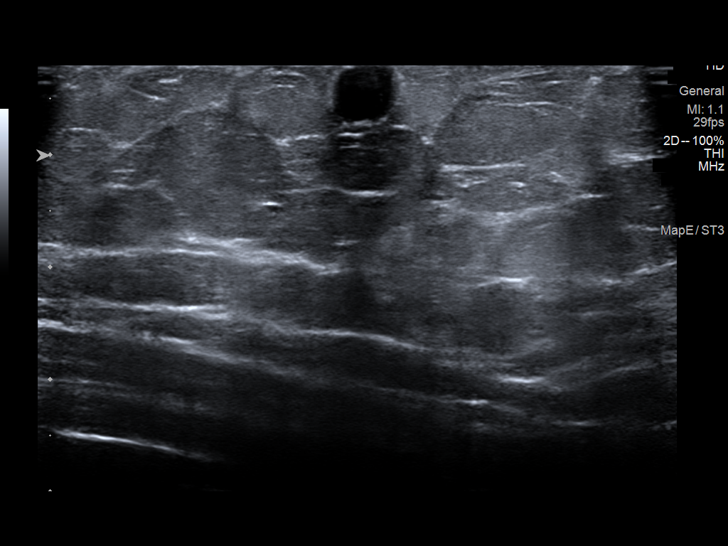
[im 2/11]
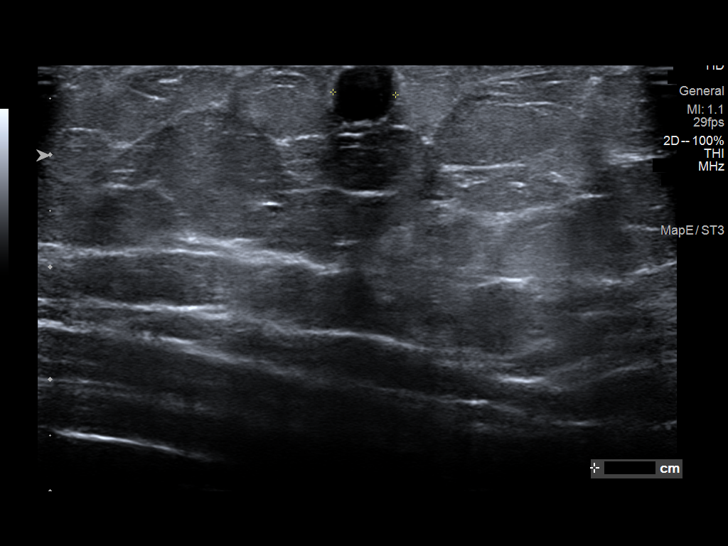
[im 3/11]
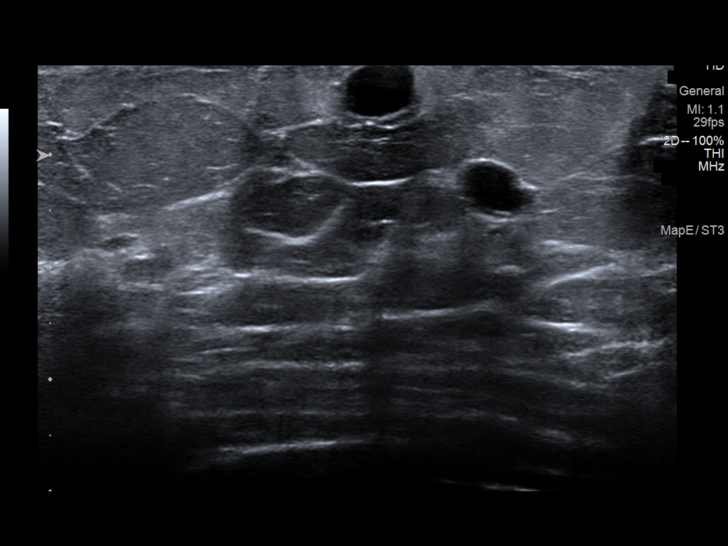
[im 4/11]
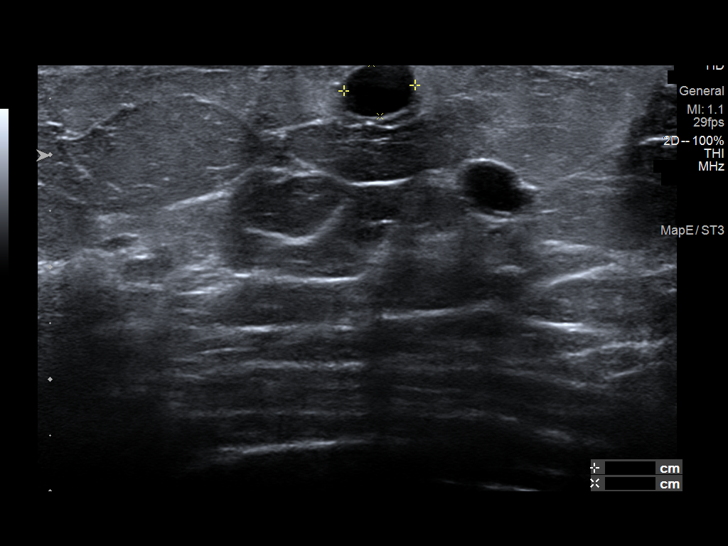
[im 5/11]
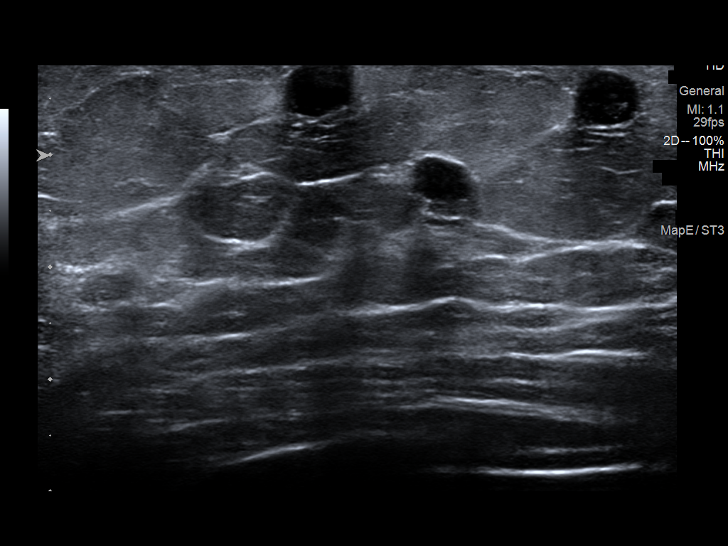
[im 6/11]
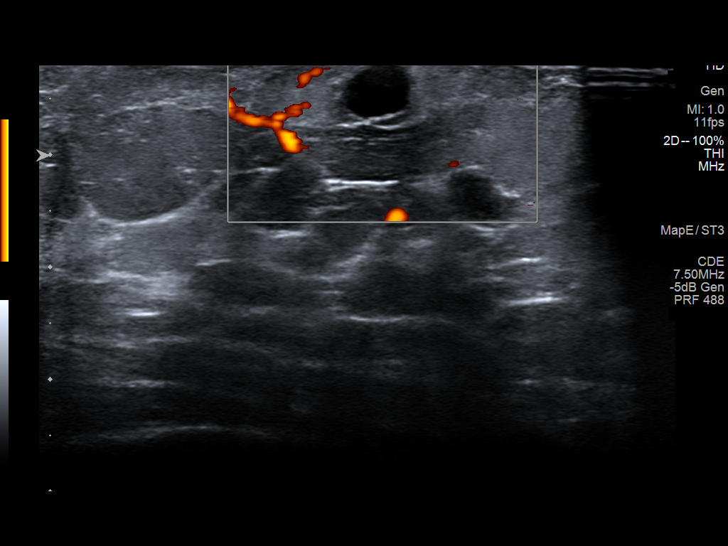
[im 7/11]
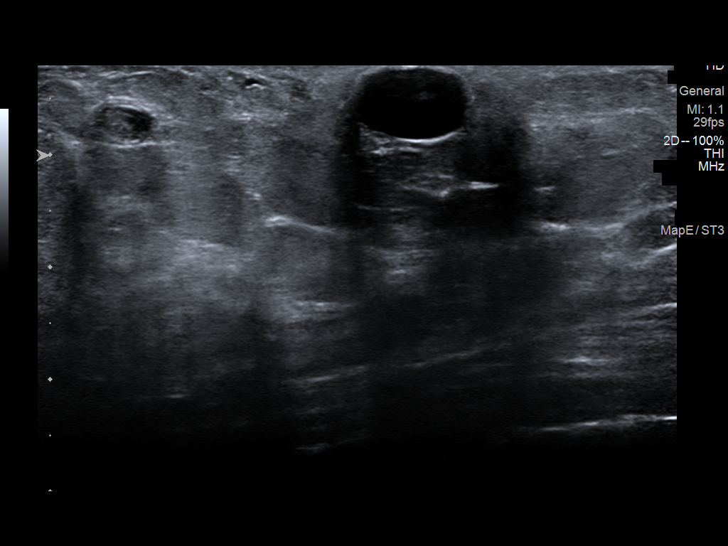
[im 8/11]
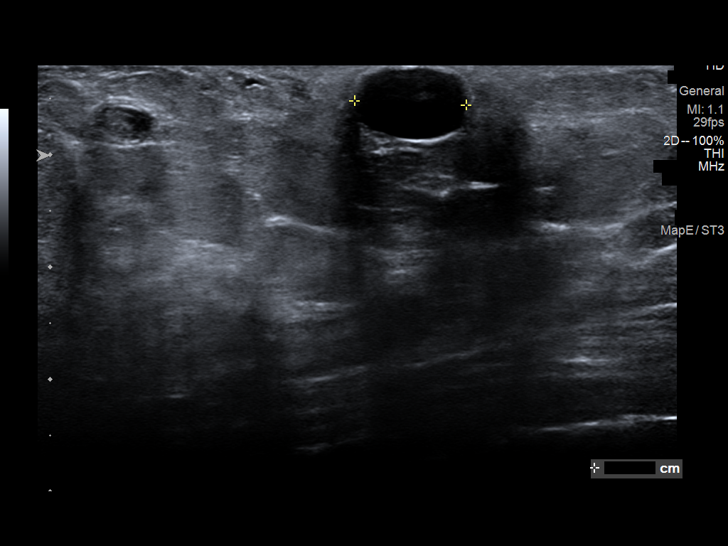
[im 9/11]
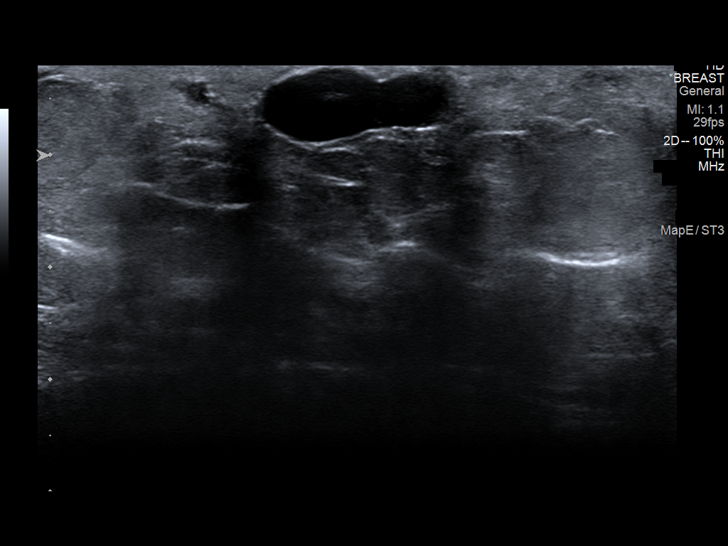
[im 10/11]
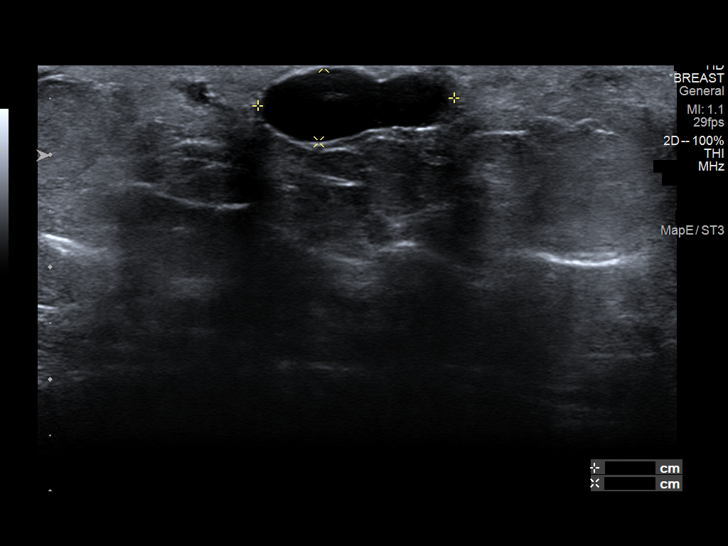
[im 11/11]
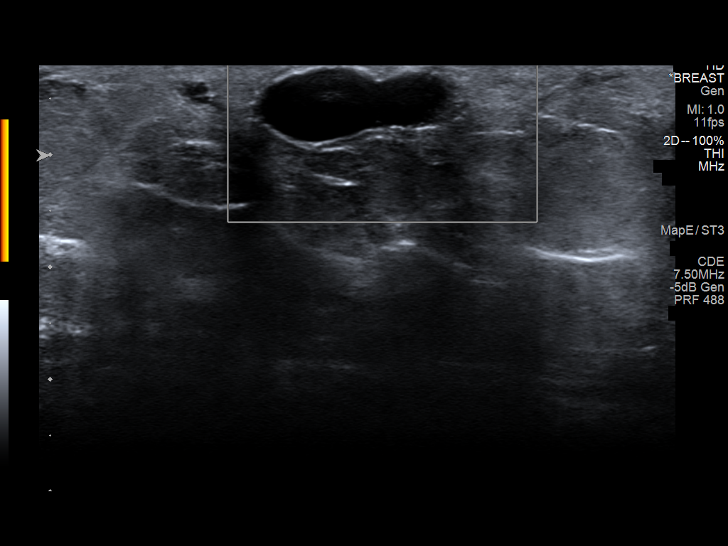

[11 of 11 positions shown; findings below may reference images not displayed]

ACR Breast Density Category b: There are scattered areas of
fibroglandular density.
FINDINGS: Mammogram:

Right breast: Skin BBs mark the palpable sites of concern reported
by the patient in the upper central and lower inner right breast.
Spot tangential views of these areas were performed in addition to
standard views. At the palpable sites there are oval circumscribed
fat containing masses. There are no additional suspicious findings
elsewhere in the right breast.

Left breast: A skin BB marks the palpable site of concern reported
by the patient in the upper outer left breast. A spot tangential
view of this area was performed in addition to standard views. At
the palpable site there is a superficial fat containing asymmetry.
There are no additional suspicious findings elsewhere in the left
breast.

On physical exam at the site of concern reported by the patient I
feel discrete small masses.

Ultrasound:

Right breast: Targeted ultrasound performed in the right breast at
the palpable site of concern at 12 o'clock 7 cm from nipple and at 3
o'clock 6 cm from the nipple demonstrating oval circumscribed
anechoic masses consistent with oil cysts given fat density on
mammogram. There is no suspicious solid mass.

Left breast: Targeted ultrasound performed in the left breast at 2
o'clock 7 cm from the nipple demonstrating a superficial slightly
hyperechoic area with cystic components, consistent with benign fat
necrosis.
IMPRESSION: At the palpable sites of concern in the bilateral breast there are
benign oil cysts and fat necrosis. No mammographic or sonographic
evidence of malignancy.

RECOMMENDATION:
Screening mammogram in one year.(Code:[BC])

I have discussed the findings and recommendations with the patient.
If applicable, a reminder letter will be sent to the patient
regarding the next appointment.

BI-RADS CATEGORY  2: Benign.

## 2021-02-27 ENCOUNTER — Other Ambulatory Visit: Payer: Self-pay | Admitting: Obstetrics and Gynecology

## 2021-02-27 DIAGNOSIS — D219 Benign neoplasm of connective and other soft tissue, unspecified: Secondary | ICD-10-CM

## 2021-03-09 ENCOUNTER — Ambulatory Visit
Admission: RE | Admit: 2021-03-09 | Discharge: 2021-03-09 | Disposition: A | Discharge status: Home / Self Care | Payer: 59 | Source: Ambulatory Visit | Attending: Obstetrics and Gynecology | Admitting: Obstetrics and Gynecology

## 2021-03-09 DIAGNOSIS — D219 Benign neoplasm of connective and other soft tissue, unspecified: Secondary | ICD-10-CM

## 2021-03-09 IMAGING — MR MR PELVIS WO/W CM
18 series · 48 of 48 positions shown · IV contrast (multihance)
Comparison: [DATE]

CLINICAL DATA: Fibroids.  Treatment planning.

EXAM:
MRI PELVIS WITHOUT AND WITH CONTRAST
TECHNIQUE: Multiplanar multisequence MR imaging of the pelvis was performed
both before and after administration of intravenous contrast.
CONTRAST:  17mL MULTIHANCE GADOBENATE DIMEGLUMINE 529 MG/ML IV SOLN

[Series 4: cor haste (include · coronal · 6.0mm · 0.74mm/px · 1 of 24 slices shown]
[im 1/24]
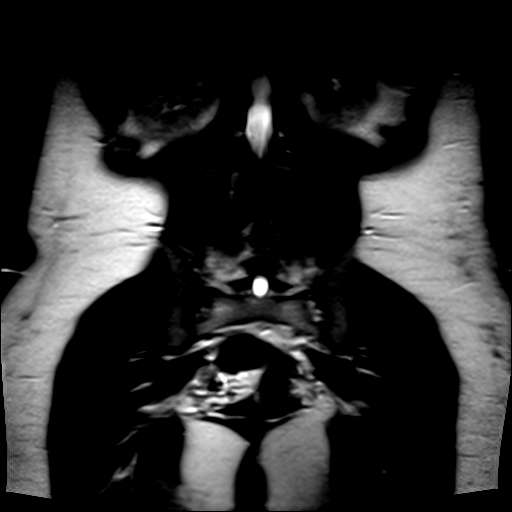

[Series 5: T2 · axial · 5.0mm · 0.94mm/px · 1 of 35 slices shown]
[im 1/35]
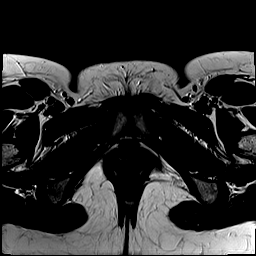

[Series 6: t2_tse axial fs · axial · 5.0mm · 0.94mm/px · 1 of 35 slices shown]
[im 1/35]
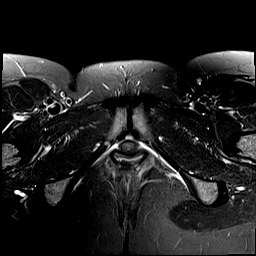

[Series 7: t2_tse_sag · sagittal · 5.0mm · 0.94mm/px · 1 of 25 slices shown (1 of 2)]
[im 1/25]
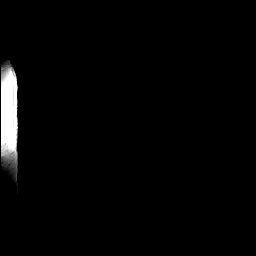

[Series 9: DWI · axial · 6.0mm · 1.82mm/px · z∈[+34,+257]mm · 3 of 96 slices shown]
[im 1/96]
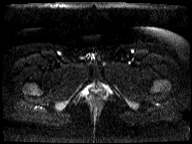
[im 48/96]
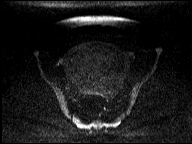
[im 96/96]
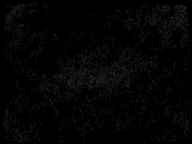

[Series 10: axial dwi_adc · axial · 6.0mm · 1.82mm/px · 1 of 32 slices shown]
[im 1/32]
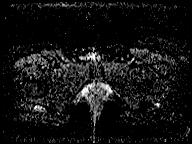

[Series 11: T1 · axial · 5.0mm · 0.55mm/px · z∈[+46,+244]mm · 2 of 74 slices shown]
[im 1/74]
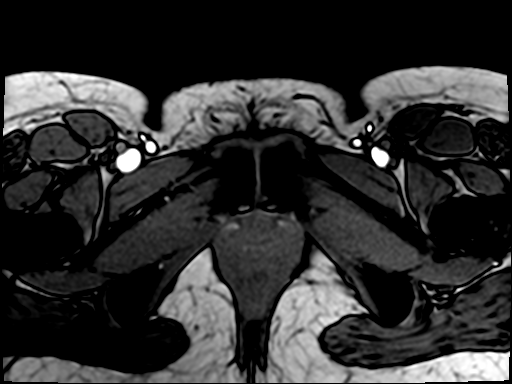
[im 74/74]
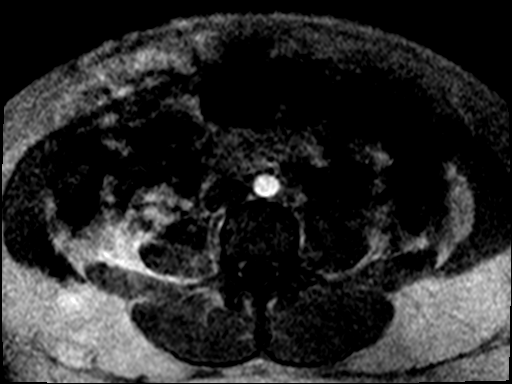

[Series 12: T1 dynamic · axial · non-contrast · 2.3mm · 1.37mm/px · z∈[+45,+245]mm · 3 of 88 slices shown]
[im 1/88]
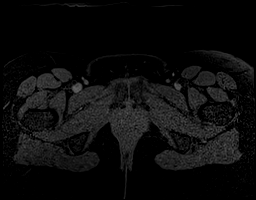
[im 44/88]
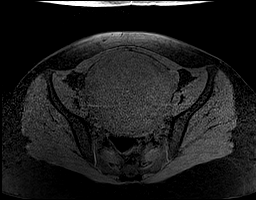
[im 88/88]
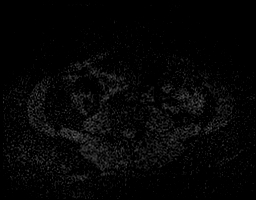

[Series 13: ax post dynamic · axial · 2.3mm · 1.37mm/px · z∈[+45,+245]mm · 4 of 88 slices shown (1 of 2)]
[im 1/88]
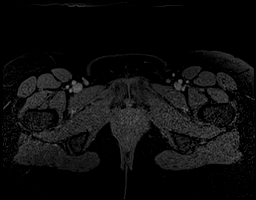
[im 30/88]
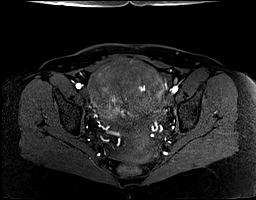
[im 59/88]
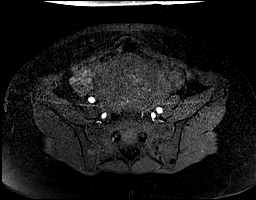
[im 88/88]
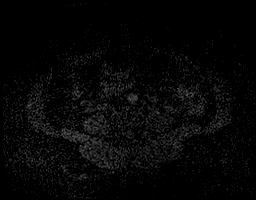

[Series 14: ax post dynamic · axial · 2.3mm · 1.37mm/px · z∈[+45,+245]mm · 4 of 88 slices shown (2 of 2)]
[im 1/88]
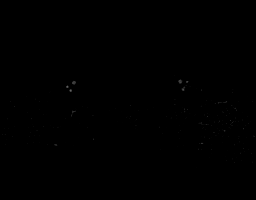
[im 30/88]
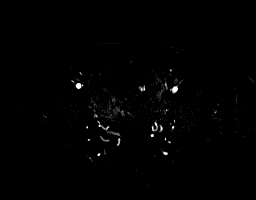
[im 59/88]
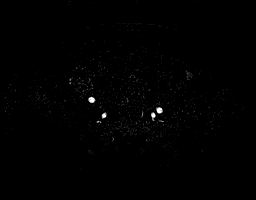
[im 88/88]
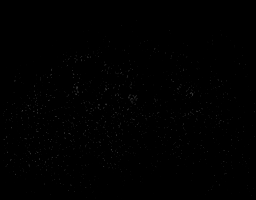

[Series 15: ax post 45 · axial · 2.3mm · 1.37mm/px · z∈[+45,+245]mm · 4 of 88 slices shown (1 of 2)]
[im 1/88]
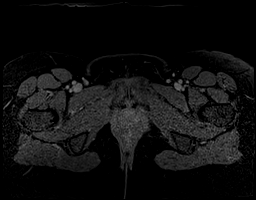
[im 30/88]
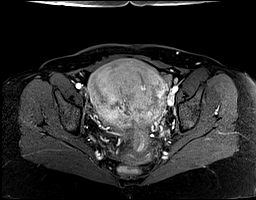
[im 59/88]
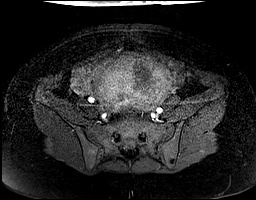
[im 88/88]
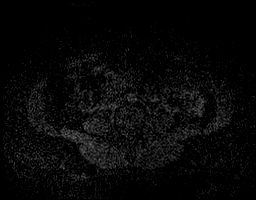

[Series 16: ax post 45 · axial · 2.3mm · 1.37mm/px · z∈[+45,+245]mm · 4 of 88 slices shown (2 of 2)]
[im 1/88]
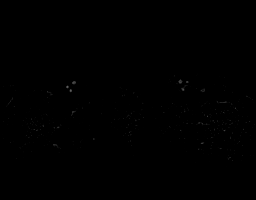
[im 30/88]
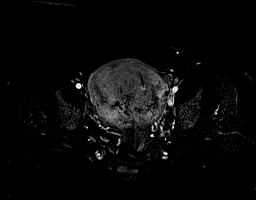
[im 59/88]
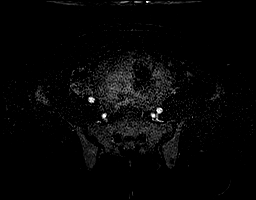
[im 88/88]
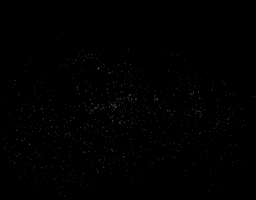

[Series 17: ax post 90 · axial · 2.3mm · 1.37mm/px · z∈[+45,+245]mm · 4 of 88 slices shown (1 of 2)]
[im 1/88]
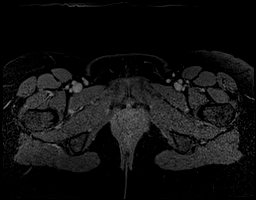
[im 30/88]
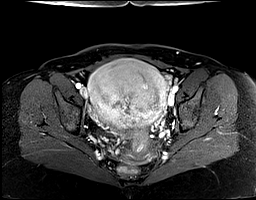
[im 59/88]
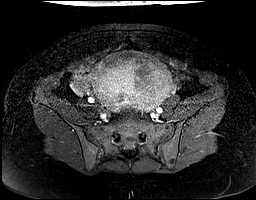
[im 88/88]
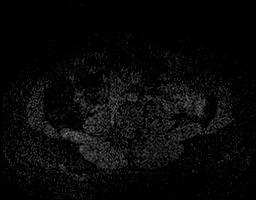

[Series 18: ax post 90 · axial · 2.3mm · 1.37mm/px · z∈[+45,+245]mm · 4 of 88 slices shown (2 of 2)]
[im 1/88]
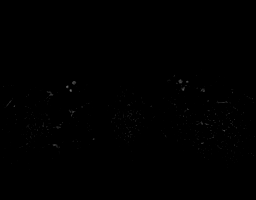
[im 30/88]
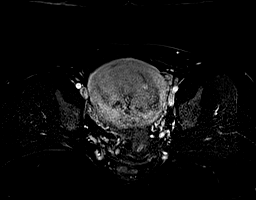
[im 59/88]
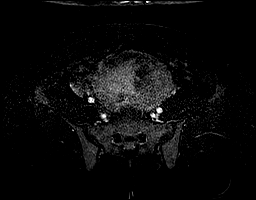
[im 88/88]
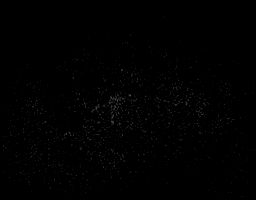

[Series 19: T1 dynamic post-contrast · sagittal · 2.5mm · 0.68mm/px · 2 of 56 slices shown]
[im 1/56]
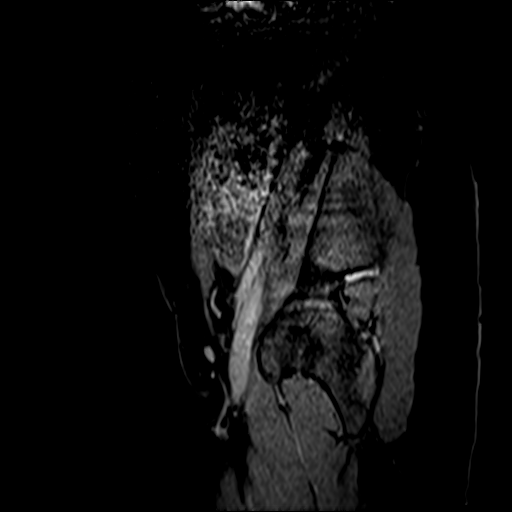
[im 56/56]
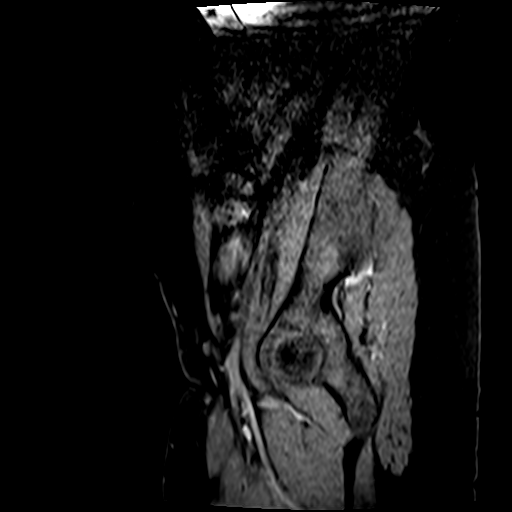

[Series 20: ax post 3+ · axial · 2.3mm · 1.37mm/px · z∈[+45,+245]mm · 4 of 88 slices shown (1 of 2)]
[im 1/88]
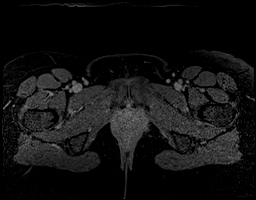
[im 30/88]
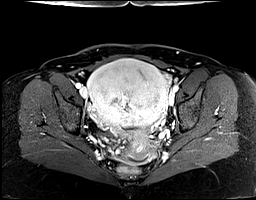
[im 59/88]
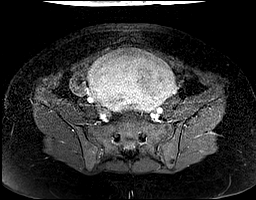
[im 88/88]
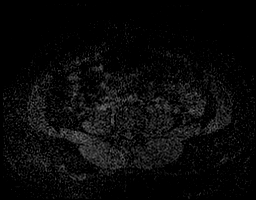

[Series 21: ax post 3+ · axial · 2.3mm · 1.37mm/px · z∈[+45,+245]mm · 4 of 88 slices shown (2 of 2)]
[im 1/88]
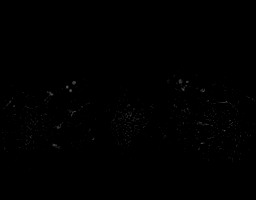
[im 30/88]
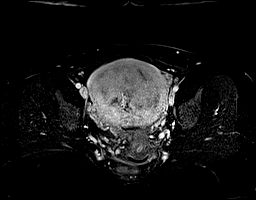
[im 59/88]
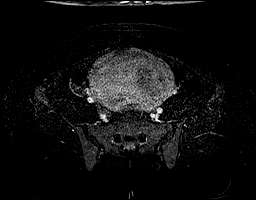
[im 88/88]
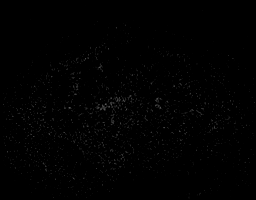

[Series 22: t2_tse_sag · sagittal · 5.0mm · 0.94mm/px · 1 of 25 slices shown (2 of 2)]
[im 1/25]
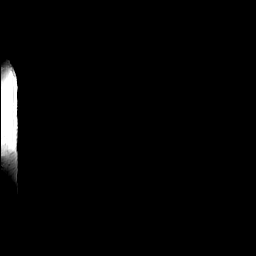

[48 of 48 positions shown; findings below may reference images not displayed]

FINDINGS: Lower Urinary Tract: No bladder or urethral abnormality identified.

Bowel:  Unremarkable visualized pelvic bowel loops.

Vascular/Lymphatic: No pathologically enlarged lymph nodes or other
significant abnormality.

Reproductive:

-- Uterus: Measures 17.1 x 10.9 by 11.7 cm (volume = [55] cm^3),
increased from approximately 670 mL previously. A single large
intramural fibroid is seen in the posterior corpus and fundus which
measures 10.3 by 10.4 x 10.1 cm, also increased in size compared to
8.6 by 7.4 x 8.8 cm previously. No evidence of restricted diffusion.
No other fibroids are identified. Thin endometrium noted. Cervix and
vagina are unremarkable in appearance.

-- Intracavitary fibroids:  None.

-- Pedunculated fibroids: None.

-- Fibroid contrast enhancement: This fibroid shows contrast
enhancement, without significant degeneration/devascularization.

-- Right ovary: 2.9 cm benign appearing ovarian cyst, most likely
physiologic in a reproductive age female.

-- Left ovary:  Appears normal.  No mass identified.

Other: No abnormal free fluid.

Musculoskeletal:  Unremarkable.
IMPRESSION: Increased size of 10.4 cm intramural fibroid in the posterior corpus
and fundus. No intracavitary or pedunculated fibroids.

2.9 cm benign-appearing right ovarian cyst. No imaging follow-up
recommended.

## 2021-03-09 MED ORDER — GADOBENATE DIMEGLUMINE 529 MG/ML IV SOLN
17.0000 mL | Freq: Once | INTRAVENOUS | Status: AC | PRN
  Administered 2021-03-09: 17 mL via INTRAVENOUS

## 2021-05-11 ENCOUNTER — Telehealth: Payer: Self-pay | Admitting: Family Medicine

## 2021-05-11 NOTE — Telephone Encounter (Signed)
Fine with me

## 2021-05-11 NOTE — Telephone Encounter (Signed)
Patient called to see if she can do a TOC from [place] to [place]. Patient needs her CPE for this year, last done 09/10/20.  Okay to schedule on Dr.Hernandez schedule?  ? ? ? ? ? ?Please advise  ? ? ?

## 2021-09-28 ENCOUNTER — Other Ambulatory Visit: Payer: Self-pay | Admitting: Internal Medicine

## 2021-09-28 ENCOUNTER — Ambulatory Visit (INDEPENDENT_AMBULATORY_CARE_PROVIDER_SITE_OTHER): Payer: 59 | Admitting: Internal Medicine

## 2021-09-28 ENCOUNTER — Encounter: Payer: Self-pay | Admitting: Internal Medicine

## 2021-09-28 VITALS — BP 110/80 | HR 59 | Temp 98.4°F | Ht 68.0 in | Wt 184.0 lb

## 2021-09-28 DIAGNOSIS — Z1231 Encounter for screening mammogram for malignant neoplasm of breast: Secondary | ICD-10-CM

## 2021-09-28 DIAGNOSIS — Z23 Encounter for immunization: Secondary | ICD-10-CM

## 2021-09-28 DIAGNOSIS — Z0000 Encounter for general adult medical examination without abnormal findings: Secondary | ICD-10-CM | POA: Diagnosis not present

## 2021-09-28 LAB — COMPREHENSIVE METABOLIC PANEL
ALT: 15 U/L (ref 0–35)
AST: 23 U/L (ref 0–37)
Albumin: 4.2 g/dL (ref 3.5–5.2)
Alkaline Phosphatase: 62 U/L (ref 39–117)
BUN: 6 mg/dL (ref 6–23)
CO2: 24 mEq/L (ref 19–32)
Calcium: 9.5 mg/dL (ref 8.4–10.5)
Chloride: 104 mEq/L (ref 96–112)
Creatinine, Ser: 0.7 mg/dL (ref 0.40–1.20)
GFR: 108.31 mL/min (ref >60.00)
Glucose, Bld: 83 mg/dL (ref 70–99)
Potassium: 3.7 mEq/L (ref 3.5–5.1)
Sodium: 137 mEq/L (ref 135–145)
Total Bilirubin: 0.6 mg/dL (ref 0.2–1.2)
Total Protein: 7.7 g/dL (ref 6.0–8.3)

## 2021-09-28 LAB — LIPID PANEL
Cholesterol: 133 mg/dL (ref 0–200)
HDL: 58.1 mg/dL (ref >39.00)
LDL Cholesterol: 66 mg/dL (ref 0–99)
NonHDL: 74.73
Total CHOL/HDL Ratio: 2
Triglycerides: 45 mg/dL (ref 0.0–149.0)
VLDL: 9 mg/dL (ref 0.0–40.0)

## 2021-09-28 LAB — CBC WITH DIFFERENTIAL/PLATELET
Basophils Absolute: 0 10*3/uL (ref 0.0–0.1)
Basophils Relative: 0.6 % (ref 0.0–3.0)
Eosinophils Absolute: 0.1 10*3/uL (ref 0.0–0.7)
Eosinophils Relative: 1.1 % (ref 0.0–5.0)
HCT: 35.8 % — ABNORMAL LOW (ref 36.0–46.0)
Hemoglobin: 11.6 g/dL — ABNORMAL LOW (ref 12.0–15.0)
Lymphocytes Relative: 45.1 % (ref 12.0–46.0)
Lymphs Abs: 2.9 10*3/uL (ref 0.7–4.0)
MCHC: 32.5 g/dL (ref 30.0–36.0)
MCV: 91.3 fl (ref 78.0–100.0)
Monocytes Absolute: 0.4 10*3/uL (ref 0.1–1.0)
Monocytes Relative: 6.2 % (ref 3.0–12.0)
Neutro Abs: 3 10*3/uL (ref 1.4–7.7)
Neutrophils Relative %: 47 % (ref 43.0–77.0)
Platelets: 150 10*3/uL (ref 150.0–400.0)
RBC: 3.92 Mil/uL (ref 3.87–5.11)
RDW: 12.5 % (ref 11.5–15.5)
WBC: 6.4 10*3/uL (ref 4.0–10.5)

## 2021-09-28 LAB — VITAMIN D 25 HYDROXY (VIT D DEFICIENCY, FRACTURES): VITD: 23.96 ng/mL — ABNORMAL LOW (ref 30.00–100.00)

## 2021-09-28 LAB — TSH: TSH: 0.96 u[IU]/mL (ref 0.35–5.50)

## 2021-09-28 LAB — HEMOGLOBIN A1C: Hgb A1c MFr Bld: 4.5 % — ABNORMAL LOW (ref 4.6–6.5)

## 2021-09-28 LAB — VITAMIN B12: Vitamin B-12: 492 pg/mL (ref 211–911)

## 2021-09-28 NOTE — Patient Instructions (Signed)
-  Nice seeing you today!!  -Lab work today; will notify you once results are available.  -Flu vaccine today. Remember your COVID vaccine.  -See you back in 1 year or sooner as needed.

## 2021-09-28 NOTE — Addendum Note (Signed)
Addended by: Westley Hummer B on: 09/28/2021 02:06 PM   Modules accepted: Orders

## 2021-09-28 NOTE — Progress Notes (Signed)
Established Patient Office Visit     CC/Reason for Visit: Annual preventive exam  HPI: Sarah Sherman is a 41 y.o. female who is coming in today for the above mentioned reasons. Past Medical History is significant for: Uterine fibroids with menometrorrhagia status post hysterectomy.  She has routine eye and dental care.  She is due for flu and bivalent COVID vaccines.  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  She has 3 children.  She does not smoke, she drinks only okay she has no known drug allergies.  Family history significant for brother with diabetes, mother with multiple sclerosis, diabetes and hypertension.   Past Medical/Surgical History: Past Medical History:  Diagnosis Date   Anemia    HSV infection    SAB (spontaneous abortion) 2010   x 2 - both in 2010    Past Surgical History:  Procedure Laterality Date   ABDOMINAL HYSTERECTOMY     CESAREAN SECTION  2011 and 2013   x 2 both in [place], [place] N/A 10/21/2013   Procedure: CESAREAN SECTION;  Surgeon: Allena Katz, MD;  Location: [facility];  Service: Obstetrics;  Laterality: N/A;   WISDOM TOOTH EXTRACTION      Social History:  reports that she has never smoked. She has never used smokeless tobacco. She reports current alcohol use. She reports that she does not use drugs.  Allergies: No Known Allergies  Family History:  Family History  Problem Relation Age of Onset   Diabetes Mother    Multiple sclerosis Mother        progressive   Diabetes Maternal Grandmother    Hypertension Maternal Grandmother    Colon cancer Maternal Aunt 41   Liver disease Maternal Aunt    Other Father        back pain; injury in [place]   Diabetes Brother    Heart disease Maternal Grandfather    Other Paternal Grandmother        brain tumor   Lung cancer Paternal Grandmother        smoker   Heart attack Paternal Grandfather 27   Healthy Brother     No current outpatient  medications on file.  Review of Systems:  Constitutional: Denies fever, chills, diaphoresis, appetite change and fatigue.  HEENT: Denies photophobia, eye pain, redness, hearing loss, ear pain, congestion, sore throat, rhinorrhea, sneezing, mouth sores, trouble swallowing, neck pain, neck stiffness and tinnitus.   Respiratory: Denies SOB, DOE, cough, chest tightness,  and wheezing.   Cardiovascular: Denies chest pain, palpitations and leg swelling.  Gastrointestinal: Denies nausea, vomiting, abdominal pain, diarrhea, constipation, blood in stool and abdominal distention.  Genitourinary: Denies dysuria, urgency, frequency, hematuria, flank pain and difficulty urinating.  Endocrine: Denies: hot or cold intolerance, sweats, changes in hair or nails, polyuria, polydipsia. Musculoskeletal: Denies myalgias, back pain, joint swelling, arthralgias and gait problem.  Skin: Denies pallor, rash and wound.  Neurological: Denies dizziness, seizures, syncope, weakness, light-headedness, numbness and headaches.  Hematological: Denies adenopathy. Easy bruising, personal or family bleeding history  Psychiatric/Behavioral: Denies suicidal ideation, mood changes, confusion, nervousness, sleep disturbance and agitation    Physical Exam: Vitals:   09/28/21 1328  BP: 110/80  Pulse: (!) 59  Temp: 98.4 F (36.9 C)  TempSrc: Oral  SpO2: 100%  Weight: 184 lb (83.5 kg)  Height: '5\' 8"'$  (1.727 m)    Body mass index is 27.98 kg/m.   Constitutional: NAD, calm, comfortable Eyes:  PERRL, lids and conjunctivae normal ENMT: Mucous membranes are moist. Posterior pharynx clear of any exudate or lesions. Normal dentition. Tympanic membrane is pearly white, no erythema or bulging. Neck: normal, supple, no masses, no thyromegaly Respiratory: clear to auscultation bilaterally, no wheezing, no crackles. Normal respiratory effort. No accessory muscle use.  Cardiovascular: Regular rate and rhythm, no murmurs / rubs /  gallops. No extremity edema. 2+ pedal pulses. No carotid bruits.  Abdomen: no tenderness, no masses palpated. No hepatosplenomegaly. Bowel sounds positive.  Musculoskeletal: no clubbing / cyanosis. No joint deformity upper and lower extremities. Good ROM, no contractures. Normal muscle tone.  Skin: no rashes, lesions, ulcers. No induration Neurologic: CN 2-12 grossly intact. Sensation intact, DTR normal. Strength 5/5 in all 4.  Psychiatric: Normal judgment and insight. Alert and oriented x 3. Normal mood.     Impression and Plan:  Encounter for preventive health examination - Plan: CBC with Differential/Platelet, Comprehensive metabolic panel, Hemoglobin A1c, Lipid panel, TSH, Vitamin B12, VITAMIN D 25 Hydroxy (Vit-D Deficiency, Fractures) -Recommend routine eye and dental care. -Immunizations: Flu vaccine in office today bivalent COVID-vaccine is pending. -Healthy lifestyle discussed in detail. -Labs to be updated today. -Colon cancer screening: Commence age 80 -Breast cancer screening: 10/2020 -Cervical cancer screening: Sees GYN Dr. Garwin Brothers -Lung cancer screening: Not applicable -Prostate cancer screening: Not applicable -DEXA: Not applicable   Need for influenza vaccination     Patient Instructions  -Nice seeing you today!!  -Lab work today; will notify you once results are available.  -Flu vaccine today. Remember your COVID vaccine.  -See you back in 1 year or sooner as needed.      Lelon Frohlich, MD [facility]

## 2021-10-05 ENCOUNTER — Encounter: Payer: Self-pay | Admitting: Internal Medicine

## 2021-10-05 ENCOUNTER — Other Ambulatory Visit: Payer: Self-pay | Admitting: Internal Medicine

## 2021-10-05 DIAGNOSIS — E559 Vitamin D deficiency, unspecified: Secondary | ICD-10-CM | POA: Insufficient documentation

## 2021-10-05 MED ORDER — VITAMIN D (ERGOCALCIFEROL) 1.25 MG (50000 UNIT) PO CAPS: 50000.0000 [IU] | ORAL_CAPSULE | ORAL | 0 refills | Status: AC

## 2021-11-02 ENCOUNTER — Ambulatory Visit
Admission: RE | Admit: 2021-11-02 | Discharge: 2021-11-02 | Disposition: A | Discharge status: Home / Self Care | Payer: 59 | Source: Ambulatory Visit | Attending: Internal Medicine | Admitting: Internal Medicine

## 2021-11-02 DIAGNOSIS — Z1231 Encounter for screening mammogram for malignant neoplasm of breast: Secondary | ICD-10-CM

## 2021-11-03 ENCOUNTER — Other Ambulatory Visit: Payer: Self-pay | Admitting: Internal Medicine

## 2021-11-03 DIAGNOSIS — R928 Other abnormal and inconclusive findings on diagnostic imaging of breast: Secondary | ICD-10-CM

## 2021-11-11 ENCOUNTER — Other Ambulatory Visit: Payer: Self-pay | Admitting: Internal Medicine

## 2021-11-11 ENCOUNTER — Ambulatory Visit
Admission: RE | Admit: 2021-11-11 | Discharge: 2021-11-11 | Disposition: A | Discharge status: Home / Self Care | Payer: 59 | Source: Ambulatory Visit | Attending: Internal Medicine | Admitting: Internal Medicine

## 2021-11-11 DIAGNOSIS — R928 Other abnormal and inconclusive findings on diagnostic imaging of breast: Secondary | ICD-10-CM

## 2021-11-11 DIAGNOSIS — R921 Mammographic calcification found on diagnostic imaging of breast: Secondary | ICD-10-CM

## 2022-05-14 ENCOUNTER — Other Ambulatory Visit: Payer: Self-pay | Admitting: Internal Medicine

## 2022-05-14 ENCOUNTER — Ambulatory Visit
Admission: RE | Admit: 2022-05-14 | Discharge: 2022-05-14 | Disposition: A | Discharge status: Home / Self Care | Payer: 59 | Source: Ambulatory Visit | Attending: Internal Medicine | Admitting: Internal Medicine

## 2022-05-14 ENCOUNTER — Telehealth: Payer: Self-pay | Admitting: Internal Medicine

## 2022-05-14 DIAGNOSIS — R921 Mammographic calcification found on diagnostic imaging of breast: Secondary | ICD-10-CM

## 2022-05-14 NOTE — Telephone Encounter (Signed)
Sarah Sherman with [facility] called to say she is sending a fax that needs to be signed ASAP, because Pt is coming into their office tomorrow.  Fax number given. Once received, we will print it out and see if Dr. Clent Ridges or another provider can sign off on it.  (In Dr. Hardie Shackleton absence)  Thank you.

## 2022-05-15 ENCOUNTER — Ambulatory Visit
Admission: RE | Admit: 2022-05-15 | Discharge: 2022-05-15 | Disposition: A | Discharge status: Home / Self Care | Payer: 59 | Source: Ambulatory Visit | Attending: Internal Medicine | Admitting: Internal Medicine

## 2022-05-15 DIAGNOSIS — R921 Mammographic calcification found on diagnostic imaging of breast: Secondary | ICD-10-CM

## 2022-05-15 HISTORY — PX: BREAST BIOPSY: SHX20

## 2022-05-18 NOTE — Telephone Encounter (Signed)
Haven't seen a form come through yet

## 2022-05-18 NOTE — Telephone Encounter (Signed)
Pt form was signed on 05/14/22 by Dr Clent Ridges

## 2022-09-02 ENCOUNTER — Encounter (INDEPENDENT_AMBULATORY_CARE_PROVIDER_SITE_OTHER): Payer: Self-pay

## 2022-09-30 ENCOUNTER — Ambulatory Visit (INDEPENDENT_AMBULATORY_CARE_PROVIDER_SITE_OTHER): Payer: 59 | Admitting: Internal Medicine

## 2022-09-30 ENCOUNTER — Encounter: Payer: Self-pay | Admitting: Internal Medicine

## 2022-09-30 VITALS — BP 110/70 | HR 69 | Temp 98.7°F | Ht 68.5 in | Wt 181.5 lb

## 2022-09-30 DIAGNOSIS — E559 Vitamin D deficiency, unspecified: Secondary | ICD-10-CM | POA: Diagnosis not present

## 2022-09-30 DIAGNOSIS — Z0000 Encounter for general adult medical examination without abnormal findings: Secondary | ICD-10-CM | POA: Diagnosis not present

## 2022-09-30 DIAGNOSIS — Z23 Encounter for immunization: Secondary | ICD-10-CM

## 2022-09-30 LAB — COMPREHENSIVE METABOLIC PANEL
ALT: 18 U/L (ref 0–35)
AST: 24 U/L (ref 0–37)
Albumin: 4.4 g/dL (ref 3.5–5.2)
Alkaline Phosphatase: 63 U/L (ref 39–117)
BUN: 12 mg/dL (ref 6–23)
CO2: 24 meq/L (ref 19–32)
Calcium: 9.6 mg/dL (ref 8.4–10.5)
Chloride: 104 meq/L (ref 96–112)
Creatinine, Ser: 0.71 mg/dL (ref 0.40–1.20)
GFR: 105.73 mL/min (ref >60.00)
Glucose, Bld: 84 mg/dL (ref 70–99)
Potassium: 4.1 meq/L (ref 3.5–5.1)
Sodium: 136 meq/L (ref 135–145)
Total Bilirubin: 0.8 mg/dL (ref 0.2–1.2)
Total Protein: 7.9 g/dL (ref 6.0–8.3)

## 2022-09-30 LAB — LIPID PANEL
Cholesterol: 140 mg/dL (ref 0–200)
HDL: 63.5 mg/dL (ref >39.00)
LDL Cholesterol: 69 mg/dL (ref 0–99)
NonHDL: 76.31
Total CHOL/HDL Ratio: 2
Triglycerides: 37 mg/dL (ref 0.0–149.0)
VLDL: 7.4 mg/dL (ref 0.0–40.0)

## 2022-09-30 LAB — CBC WITH DIFFERENTIAL/PLATELET
Basophils Absolute: 0 10*3/uL (ref 0.0–0.1)
Basophils Relative: 0.7 % (ref 0.0–3.0)
Eosinophils Absolute: 0.1 10*3/uL (ref 0.0–0.7)
Eosinophils Relative: 1.2 % (ref 0.0–5.0)
HCT: 37.9 % (ref 36.0–46.0)
Hemoglobin: 12 g/dL (ref 12.0–15.0)
Lymphocytes Relative: 38.8 % (ref 12.0–46.0)
Lymphs Abs: 2.4 10*3/uL (ref 0.7–4.0)
MCHC: 31.7 g/dL (ref 30.0–36.0)
MCV: 91.8 fl (ref 78.0–100.0)
Monocytes Absolute: 0.5 10*3/uL (ref 0.1–1.0)
Monocytes Relative: 7.2 % (ref 3.0–12.0)
Neutro Abs: 3.2 10*3/uL (ref 1.4–7.7)
Neutrophils Relative %: 52.1 % (ref 43.0–77.0)
Platelets: 183 10*3/uL (ref 150.0–400.0)
RBC: 4.13 Mil/uL (ref 3.87–5.11)
RDW: 12.8 % (ref 11.5–15.5)
WBC: 6.2 10*3/uL (ref 4.0–10.5)

## 2022-09-30 LAB — TSH: TSH: 1.04 u[IU]/mL (ref 0.35–5.50)

## 2022-09-30 LAB — VITAMIN B12: Vitamin B-12: 455 pg/mL (ref 211–911)

## 2022-09-30 LAB — VITAMIN D 25 HYDROXY (VIT D DEFICIENCY, FRACTURES): VITD: 30.11 ng/mL (ref 30.00–100.00)

## 2022-09-30 NOTE — Patient Instructions (Signed)
-  Nice seeing you today!!  -Lab work today; will notify you once results are available.  -Flu vaccine today.  -See you back in 1 year.

## 2022-09-30 NOTE — Progress Notes (Signed)
Established Patient Office Visit     CC/Reason for Visit: Annual preventive exam  HPI: Sarah Sherman is a 42 y.o. female who is coming in today for the above mentioned reasons. Past Medical History is significant for: Vitamin D deficiency.  Has been feeling well without concerns or complaints.  Has routine eye and dental care.  Is due for flu and COVID vaccines.  Cancer screening is up-to-date.  She has had a complete hysterectomy so Paps are no longer needed.   Past Medical/Surgical History: Past Medical History:  Diagnosis Date   Anemia    HSV infection    SAB (spontaneous abortion) 2010   x 2 - both in 2010    Past Surgical History:  Procedure Laterality Date   ABDOMINAL HYSTERECTOMY     AUGMENTATION MAMMAPLASTY Bilateral 11/2019   Breast Fat transfer   BREAST BIOPSY Left 05/15/2022   MM LT BREAST BX W LOC DEV 1ST LESION IMAGE BX SPEC STEREO GUIDE 05/15/2022 [facility]   CESAREAN SECTION  2011 and 2013   x 2 both in [place], [place]   CESAREAN SECTION N/A 10/21/2013   Procedure: CESAREAN SECTION;  Surgeon: Leslie Andrea, MD;  Location: [facility];  Service: Obstetrics;  Laterality: N/A;   WISDOM TOOTH EXTRACTION      Social History:  reports that she has never smoked. She has never used smokeless tobacco. She reports current alcohol use. She reports that she does not use drugs.  Allergies: No Known Allergies  Family History:  Family History  Problem Relation Age of Onset   Diabetes Mother    Multiple sclerosis Mother        progressive   Diabetes Maternal Grandmother    Hypertension Maternal Grandmother    Colon cancer Maternal Aunt 43   Liver disease Maternal Aunt    Other Father        back pain; injury in military   Diabetes Brother    Heart disease Maternal Grandfather    Other Paternal Grandmother        brain tumor   Lung cancer Paternal Grandmother        smoker   Heart attack Paternal Grandfather 87   Healthy Brother     No  current outpatient medications on file.  Review of Systems:  Negative unless indicated in HPI.   Physical Exam: Vitals:   09/30/22 0757  BP: 110/70  Pulse: 69  Temp: 98.7 F (37.1 C)  TempSrc: Oral  SpO2: 98%  Weight: 181 lb 8 oz (82.3 kg)  Height: 5' 8.5" (1.74 m)    Body mass index is 27.2 kg/m.   Physical Exam Vitals reviewed.  Constitutional:      General: She is not in acute distress.    Appearance: Normal appearance. She is not ill-appearing, toxic-appearing or diaphoretic.  HENT:     Head: Normocephalic.     Right Ear: Tympanic membrane, ear canal and external ear normal. There is no impacted cerumen.     Left Ear: Tympanic membrane, ear canal and external ear normal. There is no impacted cerumen.     Nose: Nose normal.     Mouth/Throat:     Mouth: Mucous membranes are moist.     Pharynx: Oropharynx is clear. No oropharyngeal exudate or posterior oropharyngeal erythema.  Eyes:     General: No scleral icterus.       Right eye: No discharge.        Left eye: No  discharge.     Conjunctiva/sclera: Conjunctivae normal.     Pupils: Pupils are equal, round, and reactive to light.  Neck:     Vascular: No carotid bruit.  Cardiovascular:     Rate and Rhythm: Normal rate and regular rhythm.     Pulses: Normal pulses.     Heart sounds: Normal heart sounds.  Pulmonary:     Effort: Pulmonary effort is normal. No respiratory distress.     Breath sounds: Normal breath sounds.  Abdominal:     General: Abdomen is flat. Bowel sounds are normal.     Palpations: Abdomen is soft.  Musculoskeletal:        General: Normal range of motion.     Cervical back: Normal range of motion.  Skin:    General: Skin is warm and dry.  Neurological:     General: No focal deficit present.     Mental Status: She is alert and oriented to person, place, and time. Mental status is at baseline.  Psychiatric:        Mood and Affect: Mood normal.        Behavior: Behavior normal.         Thought Content: Thought content normal.        Judgment: Judgment normal.     Flowsheet Row Office Visit from 09/30/2022 in [facility]  PHQ-9 Total Score 0        Impression and Plan:  Encounter for preventive health examination -     CBC with Differential/Platelet; Future -     Comprehensive metabolic panel; Future -     Lipid panel; Future -     Vitamin B12; Future -     TSH; Future  Vitamin D deficiency -     VITAMIN D 25 Hydroxy (Vit-D Deficiency, Fractures); Future  Immunization due  -Recommend routine eye and dental care. -Healthy lifestyle discussed in detail. -Labs to be updated today. -Prostate cancer screening: N/A Health Maintenance  Topic Date Due   Flu Shot  08/19/2022   COVID-19 Vaccine (4 - 2023-24 season) 10/16/2022*   DTaP/Tdap/Td vaccine (7 - Td or Tdap) 01/19/2023   Hepatitis C Screening  Completed   HIV Screening  Completed   HPV Vaccine  Aged Out  *Topic was postponed. The date shown is not the original due date.     -Flu vaccine in office today. -Obtain COVID-vaccine at pharmacy.     Chaya Jan, MD [facility] at [facility]

## 2022-09-30 NOTE — Addendum Note (Signed)
Addended by: Kern Reap B on: 09/30/2022 08:28 AM   Modules accepted: Orders

## 2022-11-05 ENCOUNTER — Other Ambulatory Visit: Payer: Self-pay | Admitting: Internal Medicine

## 2022-11-05 DIAGNOSIS — R921 Mammographic calcification found on diagnostic imaging of breast: Secondary | ICD-10-CM

## 2022-11-24 ENCOUNTER — Ambulatory Visit
Admission: RE | Admit: 2022-11-24 | Discharge: 2022-11-24 | Disposition: A | Discharge status: Home / Self Care | Payer: 59 | Source: Ambulatory Visit | Attending: Internal Medicine | Admitting: Internal Medicine

## 2022-11-24 DIAGNOSIS — R921 Mammographic calcification found on diagnostic imaging of breast: Secondary | ICD-10-CM

## 2023-05-16 ENCOUNTER — Encounter: Admitting: Internal Medicine

## 2023-05-16 ENCOUNTER — Ambulatory Visit: Payer: Self-pay | Admitting: *Deleted

## 2023-05-16 NOTE — Telephone Encounter (Signed)
  Chief Complaint: R breast irritation Symptoms: persistent itching, skin irritation, aching in R breast Frequency: ongoing breast irritation- 2022 Pertinent Negatives: Patient denies fever, breast pain, redness or rash, nipple discharge  Disposition: [] ED /[] Urgent Care (no appt availability in office) / [x] Appointment(In office/virtual)/ []  [facility]/ [] Home Care/ [] Refused Recommended Disposition /[] [facility]/ []  Follow-up with PCP Additional Notes: Patient is requesting futher testing for her breast symptoms   Copied from CRM [id number]. Topic: Clinical - Medical Advice >> May 16, 2023  8:52 AM Rosaria Common wrote: Reason for CRM: Pt is experiencing intense itching and pain on right breast. Reason for Disposition  Dry flaking-peeling skin of nipple  Answer Assessment - Initial Assessment Questions 1. SYMPTOM: "What's the main symptom you're concerned about?"  (e.g., lump, pain, rash, nipple discharge)     Itching and aching- dermatology has checked skin and MM was normal- told to follow up if her symptoms persisted 2. LOCATION: "Where is the itching located?"     R breast 3. ONSET: "When did symptoms  start?"     Ongoing for 2 years 4. PRIOR HISTORY: "Do you have any history of prior problems with your breasts?" (e.g., lumps, cancer, fibrocystic breast disease)     Cystic breast, hx bx- left breast 5. CAUSE: "What do you think is causing this symptom?"     unsure 6. OTHER SYMPTOMS: "Do you have any other symptoms?" (e.g., fever, breast pain, redness or rash, nipple discharge)     No visible symptoms- irritation near nipple  Protocols used: Breast Symptoms-A-AH

## 2023-05-16 NOTE — Progress Notes (Signed)
 This encounter was created in error - please disregard.

## 2023-09-28 ENCOUNTER — Other Ambulatory Visit: Payer: Self-pay | Admitting: Family Medicine

## 2023-09-28 DIAGNOSIS — R921 Mammographic calcification found on diagnostic imaging of breast: Secondary | ICD-10-CM

## 2023-12-12 ENCOUNTER — Ambulatory Visit
Admission: RE | Admit: 2023-12-12 | Discharge: 2023-12-12 | Disposition: A | Source: Ambulatory Visit | Attending: Family Medicine | Admitting: Family Medicine

## 2023-12-12 DIAGNOSIS — R921 Mammographic calcification found on diagnostic imaging of breast: Secondary | ICD-10-CM

## 2023-12-13 ENCOUNTER — Encounter

## 2023-12-14 ENCOUNTER — Other Ambulatory Visit: Payer: Self-pay | Admitting: Family Medicine

## 2023-12-14 DIAGNOSIS — Z1231 Encounter for screening mammogram for malignant neoplasm of breast: Secondary | ICD-10-CM
